# Patient Record
Sex: Male | Born: 1985 | Race: White | Hispanic: No | Marital: Single | Smoking: Former smoker
Health system: Southern US, Community
[De-identification: ages and names within clinical notes are randomized; demographics above are authoritative.]

## PROBLEM LIST (undated history)

## (undated) DIAGNOSIS — K219 Gastro-esophageal reflux disease without esophagitis: Secondary | ICD-10-CM

## (undated) DIAGNOSIS — T7840XA Allergy, unspecified, initial encounter: Secondary | ICD-10-CM

## (undated) DIAGNOSIS — R011 Cardiac murmur, unspecified: Secondary | ICD-10-CM

## (undated) DIAGNOSIS — J189 Pneumonia, unspecified organism: Secondary | ICD-10-CM

## (undated) HISTORY — DX: Allergy, unspecified, initial encounter: T78.40XA

## (undated) HISTORY — PX: HERNIA REPAIR: SHX51

## (undated) HISTORY — PX: WISDOM TOOTH EXTRACTION: SHX21

## (undated) HISTORY — DX: Gastro-esophageal reflux disease without esophagitis: K21.9

---
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
## 2006-01-18 ENCOUNTER — Ambulatory Visit: Payer: Self-pay | Admitting: Internal Medicine

## 2006-06-09 ENCOUNTER — Ambulatory Visit: Payer: Self-pay | Admitting: Internal Medicine

## 2006-08-02 ENCOUNTER — Encounter: Payer: Self-pay | Admitting: Internal Medicine

## 2006-08-02 ENCOUNTER — Ambulatory Visit: Payer: Self-pay | Admitting: Internal Medicine

## 2006-08-14 ENCOUNTER — Ambulatory Visit: Payer: Self-pay | Admitting: Internal Medicine

## 2007-02-15 ENCOUNTER — Ambulatory Visit: Payer: Self-pay | Admitting: Internal Medicine

## 2007-03-01 ENCOUNTER — Ambulatory Visit: Payer: Self-pay | Admitting: Internal Medicine

## 2008-01-09 ENCOUNTER — Ambulatory Visit: Payer: Self-pay | Admitting: Internal Medicine

## 2009-01-14 ENCOUNTER — Ambulatory Visit: Payer: Self-pay | Admitting: Family Medicine

## 2009-03-11 ENCOUNTER — Ambulatory Visit: Payer: Self-pay | Admitting: Internal Medicine

## 2009-08-18 ENCOUNTER — Ambulatory Visit: Payer: Self-pay | Admitting: Internal Medicine

## 2009-08-18 DIAGNOSIS — J309 Allergic rhinitis, unspecified: Secondary | ICD-10-CM | POA: Insufficient documentation

## 2009-08-18 LAB — CONVERTED CEMR LAB
Bilirubin Urine: NEGATIVE
Blood in Urine, dipstick: NEGATIVE
Glucose, Urine, Semiquant: NEGATIVE
Ketones, urine, test strip: NEGATIVE
Nitrite: NEGATIVE
Protein, U semiquant: NEGATIVE
Specific Gravity, Urine: 1.01
Urobilinogen, UA: 0.2
WBC Urine, dipstick: NEGATIVE
pH: 8

## 2009-11-27 ENCOUNTER — Ambulatory Visit: Payer: Self-pay | Admitting: Internal Medicine

## 2009-11-27 DIAGNOSIS — G5620 Lesion of ulnar nerve, unspecified upper limb: Secondary | ICD-10-CM

## 2010-01-19 ENCOUNTER — Encounter: Payer: Self-pay | Admitting: Internal Medicine

## 2010-01-22 ENCOUNTER — Ambulatory Visit: Payer: Self-pay | Admitting: Internal Medicine

## 2010-01-22 DIAGNOSIS — J019 Acute sinusitis, unspecified: Secondary | ICD-10-CM | POA: Insufficient documentation

## 2010-04-13 NOTE — Letter (Signed)
Summary: Medical Exam Form & Statement/[facility]  Medical Exam Form & Statement/[facility]   Imported By: Lanelle Bal 08/28/2009 09:06:52  _____________________________________________________________________  External Attachment:    Type:   Image     Comment:   External Document

## 2010-04-13 NOTE — Assessment & Plan Note (Signed)
Summary: FOR SINUS PROBLEM//PH   Vital Signs:  Patient profile:   25 year old male Weight:      168.8 pounds BMI:     26.34 Temp:     98.4 degrees F oral Pulse rate:   72 / minute Resp:     14 per minute BP sitting:   118 / 70  (left arm) Cuff size:   large  Vitals Entered By: Shonna Chock CMA (January 22, 2010 4:09 PM) CC: Cough (productive-green), stuffy head, and runny nose x 5days., URI symptoms   CC:  Cough (productive-green), stuffy head, and runny nose x 5days., and URI symptoms.  History of Present Illness:  RTI  Symptoms      This is a 25 year old man who presents with  RTI symptoms X 1 week , onset as rhinitis.  The patient reports nasal congestion, purulent nasal discharge, and productive cough with green secretions, but denies sore throat and earache.  The patient denies fever, dyspnea, and wheezing.  The patient denies headache.  The patient denies the following risk factors for Strep sinusitis: unilateral facial pain, tooth pain, and tender adenopathy.  Rx: Mucinex, Neti pot   Allergies: 1)  ! Ibuprofen  Physical Exam  General:  in no acute distress; alert,appropriate and cooperative throughout examination Ears:  External ear exam shows no significant lesions or deformities.  Otoscopic examination reveals clear canals, tympanic membranes are intact bilaterally without bulging, retraction, inflammation or discharge. Hearing is grossly normal bilaterally. Prominent vessels L TM Nose:  External nasal examination shows no deformity or inflammation. Nasal mucosa are pink and moist without lesions or exudates. Mouth:  Oral mucosa and oropharynx without lesions or exudates.  Teeth in good repair. Constantly clearing throat Lungs:  Normal respiratory effort, chest expands symmetrically. Lungs are clear to auscultation, no crackles or wheezes. Heart:  normal rate, regular rhythm, no gallop, no rub, no JVD, and grade 1/2-1  /6 systolic murmur.   Cervical Nodes:  No  lymphadenopathy noted Axillary Nodes:  No palpable lymphadenopathy   Impression & Recommendations:  Problem # 1:  BRONCHITIS-ACUTE (ICD-466.0)  His updated medication list for this problem includes:    Amoxicillin-pot Clavulanate 875-125 Mg Tabs (Amoxicillin-pot clavulanate) .Marland Kitchen... 1 every 12 hrs with a meal  Problem # 2:  SINUSITIS- ACUTE-NOS (ICD-461.9)  His updated medication list for this problem includes:    Amoxicillin-pot Clavulanate 875-125 Mg Tabs (Amoxicillin-pot clavulanate) .Marland Kitchen... 1 every 12 hrs with a meal  Complete Medication List: 1)  Amoxicillin-pot Clavulanate 875-125 Mg Tabs (Amoxicillin-pot clavulanate) .Marland Kitchen.. 1 every 12 hrs with a meal  Patient Instructions: 1)  Neti pot once daily as needed for sinus congestion. Mucinex DM for cough as needed . 2)  Drink as much NON dairy  fluid as you can tolerate for the next few days. Prescriptions: AMOXICILLIN-POT CLAVULANATE 875-125 MG TABS (AMOXICILLIN-POT CLAVULANATE) 1 every 12 hrs with a meal  #20 x 0   Entered and Authorized by:   Marga Melnick MD   Signed by:   Marga Melnick MD on 01/22/2010   Method used:   Printed then faxed to ...       [facility] [phone]* (retail)       [address]       [place], [place]  [address]       Ph: [phone]       Fax: [phone]   RxID:   [id number]  Orders Added: 1)  Est. Patient Level III [81859]

## 2010-04-13 NOTE — Assessment & Plan Note (Signed)
Summary: fill out med exam report/cbs   Vital Signs:  Patient profile:   25 year old male Height:      67.25 inches Weight:      190.2 pounds Temp:     99.0 degrees F oral Pulse rate:   72 / minute Resp:     14 per minute BP sitting:   122 / 82  (left arm) Cuff size:   large  Vitals Entered By: Shonna Chock (August 18, 2009 11:45 AM)  CC: General Medical Evaluation  Vision Screening:Left eye w/o correction: 20 / 25 Right Eye w/o correction: 20 / 25 Both eyes w/o correction:  20/ 20 Left eye with correction: 20 / 20 Right eye with correction: 20 / 15 Both eyes with correction: 20 / 15  Color vision testing: normal      Vision Entered By: Shonna Chock (August 18, 2009 11:52 AM)   CC:  General Medical Evaluation.  History of Present Illness: Mr. [name] [name] is here for completion of a medical form for [facility];he is engaged in a regular exercise program & is asymptomatic.  Preventive Screening-Counseling & Management  Caffeine-Diet-Exercise     Does Patient Exercise: yes  Allergies: 1)  ! Ibuprofen  Past History:  Past Medical History: PNA 1993 Allergic rhinitis  Past Surgical History: Inguinal herniorrhaphy age 90 months; Wisdom Teeth extraction  Family History: P aunt: lung or breast cancer;PGM: DM;PGF: MI Father: HTN Mother: negative  Siblings: negative  Social History: Never Smoked Alcohol use-no Single Regular exercise-yes: walking /running, calisthentics  5X/week w/o symptoms  Review of Systems  The patient denies anorexia, fever, weight loss, weight gain, vision loss, decreased hearing, hoarseness, chest pain, syncope, dyspnea on exertion, peripheral edema, prolonged cough, headaches, abdominal pain, melena, hematochezia, severe indigestion/heartburn, hematuria, suspicious skin lesions, difficulty walking, unusual weight change, abnormal bleeding, enlarged lymph nodes, and angioedema.    Physical Exam  General:  well-nourished ;alert,appropriate and  cooperative throughout examination Head:  Normocephalic and atraumatic without obvious abnormalities.  Eyes:  No corneal or conjunctival inflammation noted. Perrla. Funduscopic exam benign, without hemorrhages, exudates or papilledema.  Ears:  External ear exam shows no significant lesions or deformities.  Otoscopic examination reveals clear canals, tympanic membranes are intact bilaterally without bulging, retraction, inflammation or discharge. Hearing is grossly normal bilaterally. Nose:  External nasal examination shows no deformity or inflammation. Nasal mucosa are pink and moist without lesions or exudates. Mouth:  Oral mucosa and oropharynx without lesions or exudates.  Teeth in good repair. Neck:  No deformities, masses, or tenderness noted. Lungs:  Normal respiratory effort, chest expands symmetrically. Lungs are clear to auscultation, no crackles or wheezes. Heart:  normal rate, regular rhythm, no gallop, no rub, no JVD, no HJR.Clinically insignificant grade  1/2 /6 systolic murmur @ apex intermittently . Abdomen:  Bowel sounds positive,abdomen soft and non-tender without masses, organomegaly or hernias noted. Rectal:  Deferred Prostate:  Deferred Msk:  No deformity or scoliosis noted of thoracic or lumbar spine.   Pulses:  R and L carotid,radial,dorsalis pedis and posterior tibial pulses are full and equal bilaterally Extremities:  No clubbing, cyanosis, edema, or deformity noted with normal full range of motion of all joints.   Neurologic:  alert & oriented X3, strength normal in all extremities, gait normal, and DTRs symmetrical and normal.   Skin:  Intact without suspicious lesions or rashes. Tanned Cervical Nodes:  No lymphadenopathy noted Axillary Nodes:  No palpable lymphadenopathy Inguinal Nodes:  No significant adenopathy Psych:  memory intact for recent and remote, normally interactive, good eye contact, not anxious appearing, and not depressed appearing.     Impression &  Recommendations:  Problem # 1:  ROUTINE GENERAL MEDICAL EXAM@HEALTH  CARE FACL (ICD-V70.0) No contraindication to participation in [facility] training program  Other Orders: TB Skin Test [id number]) Admin 1st Vaccine (73710) UA Dipstick w/o Micro (manual) (81002)  Patient Instructions: 1)  Please pick up forms when Tb skin test read in 48-72 hrs   Immunizations Administered:  PPD Skin Test:    Vaccine Type: PPD    Site: left forearm    Mfr: Sanofi Pasteur    Dose: 0.1 ml    Route: ID    Given by: Shonna Chock    Exp. Date: 12/25/2010    Lot #: [id number] Patient aware to return in 48-72 hours for PPD (Skin Test reading)   Laboratory Results   Urine Tests    Routine Urinalysis   Color: lt. yellow Appearance: Clear Glucose: negative   (Normal Range: Negative) Bilirubin: negative   (Normal Range: Negative) Ketone: negative   (Normal Range: Negative) Spec. Gravity: 1.010   (Normal Range: 1.003-1.035) Blood: negative   (Normal Range: Negative) pH: 8.0   (Normal Range: 5.0-8.0) Protein: negative   (Normal Range: Negative) Urobilinogen: 0.2   (Normal Range: 0-1) Nitrite: negative   (Normal Range: Negative) Leukocyte Esterace: negative   (Normal Range: Negative)       Appended Document: fill out med exam report/cbs   PPD Results    Date of reading: 08/20/2009    Results: < 5mm    Interpretation: negative

## 2010-04-13 NOTE — Assessment & Plan Note (Signed)
Summary: ring finger & pinky numb/cbs   Vital Signs:  Patient profile:   25 year old male Weight:      176.2 pounds BMI:     27.49 Temp:     98.4 degrees F oral Pulse rate:   56 / minute Resp:     12 per minute BP sitting:   114 / 68  (left arm) Cuff size:   large  Vitals Entered By: Shonna Chock CMA (November 27, 2009 4:15 PM) CC: Ring/pinky finger numbness (both hands) x couple weeks, Lower Extremity Joint pain   CC:  Ring/pinky finger numbness (both hands) x couple weeks and Lower Extremity Joint pain.  History of Present Illness: He has had numbness & tingling in 4th & 5th fingers of both hands for 2 weeks.   The patient denies swelling, redness, and weakness in hands.  The symptoms began gradually but have  been  constant for past week.  He has been in a course for 4 weeks ; he catches himself leaning on elbows for prolonged periods. No PMH of elbow injury .  Current Medications (verified): 1)  None  Allergies: 1)  ! Ibuprofen  Physical Exam  General:  well-nourished,in no acute distress; alert,appropriate and cooperative throughout examination Pulses:  R and L radial pulses are full and equal bilaterally Extremities:  No clubbing, cyanosis, edema, or deformity noted .Wart 3rd R DIP area. Symptoms elicitable by percusion over ulnar nerve on L. Neurologic:  alert & oriented X3, strength normal in all extremities, and DTRs symmetrical and normal.   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 NCT/EMG if no  better. Neurontin 100 mg three times a day as needed if symptoms progress.

## 2010-04-13 NOTE — Letter (Signed)
Summary: Medical Clearance/[facility] [employer]  Medical Clearance/[employer] [facility] [employer]   Imported By: Lanelle Bal 01/28/2010 13:20:52  _____________________________________________________________________  External Attachment:    Type:   Image     Comment:   External Document

## 2011-02-15 ENCOUNTER — Ambulatory Visit (INDEPENDENT_AMBULATORY_CARE_PROVIDER_SITE_OTHER): Payer: BC Managed Care – PPO | Admitting: Internal Medicine

## 2011-02-15 ENCOUNTER — Encounter: Payer: Self-pay | Admitting: Internal Medicine

## 2011-02-15 VITALS — BP 120/80 | HR 61 | Temp 98.4°F | Ht 66.0 in | Wt 153.6 lb

## 2011-02-15 DIAGNOSIS — J40 Bronchitis, not specified as acute or chronic: Secondary | ICD-10-CM

## 2011-02-15 MED ORDER — AMOXICILLIN 500 MG PO CAPS: 1000.0000 mg | ORAL_CAPSULE | Freq: Two times a day (BID) | ORAL | Status: AC

## 2011-02-15 NOTE — Progress Notes (Signed)
  Subjective:    Patient ID: [name] [name], male    DOB: 02/28/1986, 25 y.o.   MRN: [id number]  HPI Symptoms started 2 weeks ago, initially with head congestion, now also with congestion in the chest, cough, yellow and green sputum. Has been taking Mucinex Patient is a nonsmoker  Past Medical History  Diagnosis Date  . Allergy    Past Surgical History  Procedure Date  . Hernia repair      Review of Systems No fever or chills No nausea, vomiting, diarrhea No myalgias No wheezing    Objective:   Physical Exam  Constitutional: He appears well-developed and well-nourished. No distress.  HENT:  Head: Normocephalic and atraumatic.  Right Ear: External ear normal.  Left Ear: External ear normal.  Mouth/Throat: No oropharyngeal exudate.       Face symmetric, nontender to palpation  Cardiovascular: Normal rate and regular rhythm.   No murmur heard. Pulmonary/Chest: Effort normal and breath sounds normal. No respiratory distress. He has no wheezes. He has no rales.  Skin: He is not diaphoretic.       Assessment & Plan:  Bronchitis: Exam is quite benign however this history is consistent with bronchitis. See instructions

## 2011-02-15 NOTE — Patient Instructions (Signed)
Rest, fluids , tylenol For cough, take Mucinex DM twice a day as needed  For congestion use Sudafed (pseudoephedrine) behind the counter 30 mg every 4 to 6 hours as needed Take the antibiotic as prescribed ----> Amoxicillin Call if no better in few days Call anytime if the symptoms are severe

## 2011-03-23 ENCOUNTER — Encounter: Payer: Self-pay | Admitting: Internal Medicine

## 2011-03-23 ENCOUNTER — Ambulatory Visit (INDEPENDENT_AMBULATORY_CARE_PROVIDER_SITE_OTHER): Payer: BC Managed Care – PPO | Admitting: Internal Medicine

## 2011-03-23 VITALS — BP 112/70 | HR 61 | Temp 98.7°F | Resp 12 | Wt 156.4 lb

## 2011-03-23 DIAGNOSIS — J209 Acute bronchitis, unspecified: Secondary | ICD-10-CM

## 2011-03-23 MED ORDER — AZITHROMYCIN 250 MG PO TABS: ORAL_TABLET | ORAL | Status: AC

## 2011-03-23 NOTE — Patient Instructions (Signed)
Plain Mucinex for thick secretions ;force NON dairy fluids. Use a Neti pot daily as needed for sinus congestion  

## 2011-03-23 NOTE — Progress Notes (Signed)
  Subjective:    Patient ID: [name] [name], male    DOB: [birth year], 26 y.o.   MRN: [id number]  HPI Respiratory tract infection Onset/symptoms:1 week ago as dry cough Exposures (illness/environmental/extrinsic):co-workers Progression of symptoms:to sputum production Treatments/response:Mucinex & Tylenol with some benefit Present symptoms: Fever/chills/sweats:no Frontal headache:no Facial pain:no Nasal purulence:no Sore throat:no Dental pain:no Lymphadenopathy:no Wheezing/shortness of breath:slight wheezing Cough/sputum/hemoptysis:white sputum Pleuritic pain:no Associated extrinsic/allergic symptoms:itchy eyes/ sneezing:no Past medical history: Seasonal allergies: no/asthma:only as child Smoking history:never           Review of Systems   He had bronchitis 6 weeks ago and took amoxicillin and Mucinex.     Objective:   Physical Exam General appearance:good health ;well nourished; no acute distress or increased work of breathing is present.  No  lymphadenopathy about the head, neck, or axilla noted.   Eyes: No conjunctival inflammation or lid edema is present. .  Ears:  External ear exam shows no significant lesions or deformities.  Otoscopic examination reveals clear canals, tympanic membranes are intact bilaterally without bulging, retraction, inflammation or discharge.  Nose:  External nasal examination shows no deformity or inflammation. Nasal mucosa are pink and moist without lesions or exudates. No septal dislocation or deviation.No obstruction to airflow.   Oral exam: Dental hygiene is good; lips and gums are healthy appearing.There is no oropharyngeal erythema or exudate noted.      Heart:  Normal rate and regular rhythm. S1 and S2 normal without gallop,  click, rub or other extra sounds. Grade 1/2 systolic murmur   Lungs:Chest clear to auscultation; no wheezes, rhonchi,rales ,or rubs present.No increased work of breathing.    Extremities:  No cyanosis,  edema, or clubbing  noted    Skin: Warm & dry        Assessment & Plan:  #1 bronchitis, acute. Some  wheezing reported; none on exam at this time. No history or physical findings to suggest associated rhinosinusitis  Plan: See orders and recommendations

## 2011-04-25 ENCOUNTER — Encounter: Payer: Self-pay | Admitting: Internal Medicine

## 2011-04-25 ENCOUNTER — Ambulatory Visit (INDEPENDENT_AMBULATORY_CARE_PROVIDER_SITE_OTHER): Payer: BC Managed Care – PPO | Admitting: Internal Medicine

## 2011-04-25 DIAGNOSIS — R5383 Other fatigue: Secondary | ICD-10-CM

## 2011-04-25 DIAGNOSIS — R259 Unspecified abnormal involuntary movements: Secondary | ICD-10-CM

## 2011-04-25 DIAGNOSIS — R5381 Other malaise: Secondary | ICD-10-CM

## 2011-04-25 DIAGNOSIS — R251 Tremor, unspecified: Secondary | ICD-10-CM

## 2011-04-25 LAB — CBC WITH DIFFERENTIAL/PLATELET
Basophils Absolute: 0 10*3/uL (ref 0.0–0.1)
Basophils Relative: 0.4 % (ref 0.0–3.0)
Eosinophils Absolute: 0.1 10*3/uL (ref 0.0–0.7)
Eosinophils Relative: 1.5 % (ref 0.0–5.0)
HCT: 42.5 % (ref 39.0–52.0)
Hemoglobin: 15 g/dL (ref 13.0–17.0)
Lymphocytes Relative: 27.7 % (ref 12.0–46.0)
Lymphs Abs: 1.6 10*3/uL (ref 0.7–4.0)
MCHC: 35.2 g/dL (ref 30.0–36.0)
MCV: 98.5 fl (ref 78.0–100.0)
Monocytes Absolute: 0.5 10*3/uL (ref 0.1–1.0)
Monocytes Relative: 9.1 % (ref 3.0–12.0)
Neutro Abs: 3.6 10*3/uL (ref 1.4–7.7)
Neutrophils Relative %: 61.3 % (ref 43.0–77.0)
Platelets: 189 10*3/uL (ref 150.0–400.0)
RBC: 4.32 Mil/uL (ref 4.22–5.81)
RDW: 12.4 % (ref 11.5–14.6)
WBC: 5.8 10*3/uL (ref 4.5–10.5)

## 2011-04-25 LAB — BASIC METABOLIC PANEL
BUN: 17 mg/dL (ref 6–23)
CO2: 29 mEq/L (ref 19–32)
Calcium: 9.3 mg/dL (ref 8.4–10.5)
Chloride: 103 mEq/L (ref 96–112)
Creatinine, Ser: 1 mg/dL (ref 0.4–1.5)
GFR: 100.6 mL/min (ref >60.00)
Glucose, Bld: 78 mg/dL (ref 70–99)
Potassium: 4.4 mEq/L (ref 3.5–5.1)
Sodium: 137 mEq/L (ref 135–145)

## 2011-04-25 LAB — SEDIMENTATION RATE: Sed Rate: 5 mm/hr (ref 0–22)

## 2011-04-25 LAB — HEPATIC FUNCTION PANEL
ALT: 17 U/L (ref 0–53)
AST: 21 U/L (ref 0–37)
Albumin: 4.4 g/dL (ref 3.5–5.2)
Alkaline Phosphatase: 59 U/L (ref 39–117)
Bilirubin, Direct: 0.2 mg/dL (ref 0.0–0.3)
Total Bilirubin: 1.2 mg/dL (ref 0.3–1.2)
Total Protein: 7.3 g/dL (ref 6.0–8.3)

## 2011-04-25 LAB — T4, FREE: Free T4: 0.88 ng/dL (ref 0.60–1.60)

## 2011-04-25 LAB — TSH: TSH: 1.48 u[IU]/mL (ref 0.35–5.50)

## 2011-04-25 NOTE — Patient Instructions (Addendum)
Symptoms could indicate "reactive hypoglycemia", low sugars due to the pancreas excreting excess insulin in response to glucose elevations from " hyperglycemic carbs"  in diet. Eat a low-fat diet with lots of fruits and vegetables, up to 7-9 servings per day. Consume less than 40  Grams (preferably ZERO) of sugar per day from foods & drinks with High Fructose Corn Sugar as #1,2,3 or # 4 on label. Follow a low carb nutrition program such as The [employer] Sugar [employer] or [place] to prevent wide sugar swings. White carbohydrates (potatoes, rice, bread, and pasta) have a high spike of sugar and a high load of sugar. For example a  baked potato has a cup of sugar and a  french fry  2 teaspoons of sugar. Yams, wild  rice, whole grained bread &  wheat pasta have been much lower spike and load of  sugar. Portions should be the size of a deck of cards or your palm.  A protein bar may  help prevent wide fluctuations in sugars well.  To prevent tremor , avoid stimulants such as decongestants, diet pills, nicotine, or caffeine (coffee, tea, cola, or chocolate) to excess & get adequate sleep

## 2011-04-25 NOTE — Progress Notes (Signed)
Subjective:    Patient ID: [name] [name], male    DOB: 17-Apr-1985, 26 y.o.   MRN: [id number]  HPI FATIGUE Onset: 6 months  Fatigue @ rest : yes   ;  not worse with exertion  Primarily motivational fatigue:yes; work is sedentary Symptoms: Fever/ chills : no  Night sweats: no                                                                                            Vision changes ( blurred/ double/ loss): no                                                                                                   Hoarseness or swallowing dysfunction: no                                                                                         Bowel changes( constipation/ diarrhea): no                                                                                    Weight change: stable 155-8 Exertional chest pain: no Dyspnea on exertion: no  Cough: no  Hemoptysis: no  New medications: no Leg swelling: no  Orthopnea: no  PND: no Melena/ rectal bleeding: no Adenopathy: no Severe snoring:no Daytime sleepiness:no Skin / hair / nail changes:no Temperature intolerance( heat/ cold) : no                                                                                                 Feeling depressed:no  Anhedonia:no  Altered appetite: no  Poor sleep/ Apnea : no Abnormal bruising /  bleeding or enlarged lymph nodes: no                                                                     PMH/ FH of thyroid disease: no     Review of Systems   He denies any significant joint swelling, pain, or redness. He has had intermittent slight weakness between meals; he denies associated tachycardia, diaphoresis, or near syncope. At times he has difficulty finishing his meal during the workday     Objective:   Physical Exam Gen.: Healthy and well-nourished in appearance. Alert, appropriate and cooperative throughout exam. Head: Normocephalic without obvious abnormalities; no alopecia  Eyes: No corneal or  conjunctival inflammation noted. Pupils equal round reactive to light and accommodation.  Extraocular motion intact. No proptosis or lid lag  Neck: No deformities, masses, or tenderness noted. Range of motion &  Thyroid normal Lungs: Normal respiratory effort; chest expands symmetrically. Lungs are clear to auscultation without rales, wheezes, or increased work of breathing. Heart: Normal rate and rhythm. Normal S1 and S2. No gallop, click, or rub. Grade 1/6 systolic murmur  Abdomen: Bowel sounds normal; abdomen soft and nontender. No masses, organomegaly or hernias noted.                                                                  Musculoskeletal/extremities: No deformity or scoliosis noted of  the thoracic or lumbar spine. No clubbing, cyanosis, edema, or deformity noted. Range of motion  normal .Tone & strength  normal.Joints normal. Nail health  Good.No onycholysis Vascular: Carotid, radial artery, dorsalis pedis and  posterior tibial pulses are full and equal. No bruits present. Neurologic: Alert and oriented x3. Deep tendon reflexes symmetrical and 1/2+. Minor hand tremor         Skin: Intact without suspicious lesions or rashes. Lymph: No cervical, axillary lymphadenopathy present. Psych: Mood and affect are normal. Normally interactive                                                                                         Assessment & Plan:    #1 fatigue x6 months without definite localizing signs or symptoms  #2 possible intermittent reactive hypoglycemia  #3 mild tremor  Plan: See orders and recommendations

## 2012-01-05 ENCOUNTER — Encounter: Payer: Self-pay | Admitting: Internal Medicine

## 2012-01-05 ENCOUNTER — Ambulatory Visit (INDEPENDENT_AMBULATORY_CARE_PROVIDER_SITE_OTHER): Payer: BC Managed Care – PPO | Admitting: Internal Medicine

## 2012-01-05 VITALS — BP 126/88 | HR 63 | Temp 98.7°F | Wt 167.6 lb

## 2012-01-05 DIAGNOSIS — J00 Acute nasopharyngitis [common cold]: Secondary | ICD-10-CM

## 2012-01-05 MED ORDER — FLUTICASONE PROPIONATE 50 MCG/ACT NA SUSP: 1.0000 | Freq: Two times a day (BID) | NASAL | Status: DC | PRN

## 2012-01-05 MED ORDER — AMOXICILLIN 500 MG PO CAPS: 500.0000 mg | ORAL_CAPSULE | Freq: Three times a day (TID) | ORAL | Status: DC

## 2012-01-05 NOTE — Progress Notes (Signed)
  Subjective:    Patient ID: [name] [name], male    DOB: Sep 14, 1985, 26 y.o.   MRN: [id number]  HPI  Symptoms began one week ago as pressure in both the frontal and maxillary sinuses; this progressed to persistent congestion. He's also noted decreased hearing.  He has not had fever, chills, sweats, or nasal purulence. Tylenol has been of some benefit.  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    Review of Systems  There's been no associated cough or sputum production. There was no extrinsic picture prior to the onset of symptoms. Specifically he denies itchy, watery eyes or sneezing.     Objective:   Physical Exam General appearance:good health ;well nourished; no acute distress or increased work of breathing is present.  No  lymphadenopathy about the head, neck, or axilla noted.   Eyes: No conjunctival inflammation or lid edema is present.   Ears:  External ear exam shows no significant lesions or deformities.  Otoscopic examination reveals clear canals, tympanic membranes are intact bilaterally without bulging, retraction, inflammation or discharge. Hearing is intact to whisper at 6 ft bilaterally  Nose:  External nasal examination shows no deformity or inflammation. Nasal mucosa are pink and moist without lesions or exudates. No septal dislocation or deviation.No obstruction to airflow.   Oral exam: Dental hygiene is good; lips and gums are healthy appearing.There is no oropharyngeal erythema or exudate noted.   Neck:  No deformities,  masses, or tenderness noted.      Heart:  Normal rate and regular rhythm. S1 and S2 normal without gallop, murmur, click, rub or other extra sounds.   Lungs:Chest clear to auscultation; no wheezes, rhonchi,rales ,or rubs present.No increased work of breathing.    Extremities:  No cyanosis, edema, or clubbing  noted    Skin: Warm & dry          Assessment & Plan:  #1 sinus congestion with frontal headache and facial  pain; absence of fever and nasal purulence. Occupational exposure to dusts and possibly mold.  Plan: See orders and recommendations

## 2012-01-05 NOTE — Patient Instructions (Addendum)
Plain Mucinex for thick secretions ;force NON dairy fluids . Use a Neti pot daily as needed for sinus congestion; going from open side to congested side . Nasal cleansing in the shower as discussed. Make sure that all residual soap is removed to prevent irritation. Fluticasone 1 spray in each nostril twice a day as needed. Use the "crossover" technique as discussed. Plain Allegra 160 daily as needed for itchy eyes & sneezing.  Fill the antibiotic only if you're having fever and purulent nasal secretions.

## 2012-01-23 ENCOUNTER — Telehealth: Payer: Self-pay | Admitting: Internal Medicine

## 2012-01-23 MED ORDER — AMOXICILLIN 500 MG PO CAPS: 500.0000 mg | ORAL_CAPSULE | Freq: Three times a day (TID) | ORAL | Status: DC

## 2012-01-23 NOTE — Telephone Encounter (Signed)
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

## 2012-01-23 NOTE — Telephone Encounter (Signed)
Amox 500 mg tid #21 

## 2012-01-23 NOTE — Telephone Encounter (Signed)
Dr.Hopper patient was seen 19 days ago and is calling about rx given on 01-05-12. Please advise

## 2012-01-23 NOTE — Telephone Encounter (Signed)
Rx sent to pharmacy, mother aware

## 2012-04-04 ENCOUNTER — Ambulatory Visit (INDEPENDENT_AMBULATORY_CARE_PROVIDER_SITE_OTHER): Payer: BC Managed Care – PPO | Admitting: Internal Medicine

## 2012-04-04 ENCOUNTER — Encounter: Payer: Self-pay | Admitting: Internal Medicine

## 2012-04-04 VITALS — BP 122/70 | HR 74 | Temp 99.0°F | Wt 176.0 lb

## 2012-04-04 DIAGNOSIS — J0100 Acute maxillary sinusitis, unspecified: Secondary | ICD-10-CM

## 2012-04-04 DIAGNOSIS — J209 Acute bronchitis, unspecified: Secondary | ICD-10-CM

## 2012-04-04 MED ORDER — CEFUROXIME AXETIL 500 MG PO TABS: 500.0000 mg | ORAL_TABLET | Freq: Two times a day (BID) | ORAL | Status: DC

## 2012-04-04 NOTE — Progress Notes (Signed)
  Subjective:    Patient ID: [name] [name], male    DOB: 25-Jan-1986, 27 y.o.   MRN: [id number]  HPI The respiratory tract symptoms began eve of  04/01/12  as head congestion & rhinitis. Significant active  associated symptoms include  facial pressure, dental pain, slight sore throat, & nasal purulence. Cough is  associated with  shortness of breath and wheezing . Sputum is described as brown/ green     Flu shot not current        Treatment with  Neti pot & Mucinex was partially effective   There is no history of asthma. The patient had never smoked quit smoking in                  Review of Systems Symptoms not present include frontal headache,  sore throat, earache , and otic discharge  Fever,chills & sweats not present   Myalgias and arthralgias were not present   Extrinsic symptoms of itchy, watery eyes, or sneezing were not present .     Objective:   Physical Exam General appearance:good health ;well nourished; no acute distress or increased work of breathing is present.  No  lymphadenopathy about the head, neck, or axilla noted.  Eyes: No conjunctival inflammation or lid edema is present. Ears:  External ear exam shows no significant lesions or deformities.  Otoscopic examination reveals clear canals, tympanic membranes are intact bilaterally without bulging, retraction, inflammation or discharge. Nose:  External nasal examination shows no deformity or inflammation. Nasal mucosa are boggy without lesions or exudates. No septal dislocation or deviation.No obstruction to airflow. Hyponasal speech Oral exam: Dental hygiene is good; lips and gums are healthy appearing.There is no oropharyngeal erythema or exudate noted.  Neck:  No deformities, masses, or tenderness noted.    Heart:  Normal rate and regular rhythm. S1 and S2 normal without gallop, murmur, click, rub or other extra sounds.  Lungs:Chest clear to auscultation; no wheezes, rhonchi,rales ,or rubs  present.No increased work of breathing.   Extremities:  No cyanosis, edema, or clubbing  noted  Skin: Warm & dry        Assessment & Plan:  #85maxillary  rhinosinusitis with bronchitis  Plan: Nasal hygiene interventions discussed. See prescription medications

## 2012-04-04 NOTE — Patient Instructions (Addendum)

## 2012-05-01 ENCOUNTER — Telehealth: Payer: Self-pay | Admitting: *Deleted

## 2012-05-01 NOTE — Telephone Encounter (Signed)
Dr. Marisue Ivan office called back & their office is about to close for the day so the pt's mom needs to be called to notify her whether or not the pt needs to take an antibiotic. The pt's mother can be reached at [phone]. If the pt does need to take an antibiotic then Dr. Alwyn Ren will need to call this into the pt's pharmacy. If the pt does not need to take an antibiotic then this needs to be faxed to Dr. Marisue Ivan office @ [phone].

## 2012-05-01 NOTE — Telephone Encounter (Signed)
Pt's dentist office called stating that the pt is coming in the morning & they need to know if the pt needs to be pre-medicated with an antibiotic before his dental procedure. Please advise.

## 2012-05-01 NOTE — Telephone Encounter (Signed)
Call Pt mom and obtain Pt contact info [phone]. Discuss with patient

## 2012-05-01 NOTE — Telephone Encounter (Signed)
I reviewed the old records; he has a grade 1/2 over 6 systolic murmur. This is insignificant and does not require SBE prophylaxis before dental work.

## 2012-05-01 NOTE — Telephone Encounter (Signed)
Hopp please advise, no notation in patient's chart that he needs to be pre-medicated. No EKG on file for patient

## 2013-01-17 ENCOUNTER — Encounter: Payer: Self-pay | Admitting: Internal Medicine

## 2013-01-17 ENCOUNTER — Ambulatory Visit (INDEPENDENT_AMBULATORY_CARE_PROVIDER_SITE_OTHER): Payer: BC Managed Care – PPO | Admitting: Internal Medicine

## 2013-01-17 VITALS — BP 125/82 | HR 89 | Temp 98.9°F

## 2013-01-17 DIAGNOSIS — J209 Acute bronchitis, unspecified: Secondary | ICD-10-CM

## 2013-01-17 MED ORDER — AZITHROMYCIN 250 MG PO TABS: ORAL_TABLET | ORAL | Status: DC

## 2013-01-17 NOTE — Progress Notes (Signed)
  Subjective:    Patient ID: [name] [name], male    DOB: 1985/06/24, 27 y.o.   MRN: [id number]  HPI   Symptoms began 01/06/13 as head congestion and sore throat. Over the next 48 hours he developed chest congestion with production of yellow sputum. He also had some yellow nasal drainage. The sputum was a greater volume than the nasal discharge.  He has been using Mucinex cough syrup was some response. Symptoms have improved. He now has a cough intermittently productive of clear scant sputum    Review of Systems  He did not have any extrinsic symptoms of itchy, watery eyes, sneezing. There was no associated fever, chills, sweats. With a cough with no shortness of breath or wheezing.  He also denied signs of rhinosinusitis such as frontal headache, facial pain, dental pain, otic pain, or otic discharge.     Objective:   Physical Exam General appearance:good health ;well nourished; no acute distress or increased work of breathing is present.  No  lymphadenopathy about the head, neck, or axilla noted.   Eyes: No conjunctival inflammation or lid edema is present.   Ears:  External ear exam shows no significant lesions or deformities.  Otoscopic examination reveals clear canals, tympanic membranes are intact bilaterally without bulging, retraction, inflammation or discharge.  Nose:  External nasal examination shows no deformity or inflammation. Nasal mucosa are erythematous without lesions or exudates. No septal dislocation or deviation.No obstruction to airflow.   Oral exam: Dental hygiene is good; lips and gums are healthy appearing.There is no oropharyngeal erythema or exudate noted.   Neck:  No deformities,  masses, or tenderness noted.     Heart:  Normal rate and regular rhythm. S1 and S2 normal without gallop, murmur, click, rub or other extra sounds.   Lungs:Chest clear to auscultation; no wheezes, rhonchi,rales ,or rubs present.No increased work of breathing.    Extremities:  No  cyanosis, edema, or clubbing  noted    Skin: Warm & dry         Assessment & Plan:  #1 acute bronchitis w/o bronchospasm #2 URI, essentially resolved Plan: See orders and recommendations

## 2013-01-17 NOTE — Patient Instructions (Signed)

## 2013-01-28 ENCOUNTER — Other Ambulatory Visit: Payer: Self-pay | Admitting: *Deleted

## 2013-01-28 ENCOUNTER — Telehealth: Payer: Self-pay | Admitting: Internal Medicine

## 2013-01-28 DIAGNOSIS — J209 Acute bronchitis, unspecified: Secondary | ICD-10-CM

## 2013-01-28 DIAGNOSIS — J0100 Acute maxillary sinusitis, unspecified: Secondary | ICD-10-CM

## 2013-01-28 MED ORDER — CEFUROXIME AXETIL 500 MG PO TABS: 500.0000 mg | ORAL_TABLET | Freq: Two times a day (BID) | ORAL | Status: DC

## 2013-01-28 NOTE — Telephone Encounter (Signed)
Please advise 

## 2013-01-28 NOTE — Telephone Encounter (Signed)
Continue aggressive nasal hygiene as discussed. Cefuroxime 500 mg bid # 14

## 2013-01-28 NOTE — Telephone Encounter (Signed)
Patient's Mom is calling because patient still has symptoms of sinus infection since he came in on 01/17/13. Mom states "he is still seeing color in mucus and head is still stopped up." She wants to know if something stronger can be called in. Please advise.

## 2013-01-29 NOTE — Telephone Encounter (Signed)
Medication called into the pharmacy.

## 2013-02-12 ENCOUNTER — Encounter: Payer: Self-pay | Admitting: Internal Medicine

## 2013-02-12 ENCOUNTER — Ambulatory Visit (INDEPENDENT_AMBULATORY_CARE_PROVIDER_SITE_OTHER): Payer: BC Managed Care – PPO | Admitting: Internal Medicine

## 2013-02-12 VITALS — BP 99/65 | HR 73 | Temp 96.7°F | Ht 68.25 in | Wt 173.4 lb

## 2013-02-12 DIAGNOSIS — J019 Acute sinusitis, unspecified: Secondary | ICD-10-CM

## 2013-02-12 DIAGNOSIS — J209 Acute bronchitis, unspecified: Secondary | ICD-10-CM

## 2013-02-12 MED ORDER — CLARITHROMYCIN ER 500 MG PO TB24: 1000.0000 mg | ORAL_TABLET | Freq: Every day | ORAL | Status: DC

## 2013-02-12 NOTE — Progress Notes (Signed)
   Subjective:    Patient ID: [name] [name], male    DOB: 12-27-1985, 27 y.o.   MRN: [id number]  HPI   Symptoms began 02/09/13 as head congestion followed 11/30 by chest congestion with productive cough, shortness of breath, and wheezing. He describes some discomfort in the right lateral thorax due to the coughing.  He also describes pain in the frontal sinus and maxillary sinus areas and nasal purulence as well as dental pain.  There is more purulent material from the chest than from the head  He's had associated chills.  He had some mild sneezing.  He does have a history of asthma. He smokes 2 pp week  His only treatment has been using Mucinex and a Neti pot.    Review of Systems  He has not had itchy, watery eyes. He also denies pelvic pain or otic discharge. There's been no associa  Heted fever and sweats.     Objective:   Physical Exam  General appearance:good health ;well nourished; no acute distress or increased work of breathing is present.  No  lymphadenopathy about the head, neck, or axilla noted.  Appears tired  Eyes: No conjunctival inflammation or lid edema is present.   Ears:  External ear exam shows no significant lesions or deformities.  Otoscopic examination reveals clear canals, tympanic membranes are intact bilaterally without bulging, retraction, inflammation or discharge.  Nose:  External nasal examination shows no deformity or inflammation. Nasal mucosa are erythematous without lesions or exudates. R septal  Deviation w/o obstruction to airflow.   Oral exam: Dental hygiene is good; lips and gums are healthy appearing.There is no oropharyngeal erythema or exudate noted.   Neck:  No deformities,  masses, or tenderness noted.      Heart:  Normal rate and regular rhythm. S1 and S2 normal without gallop, murmur, click, rub or other extra sounds.   Lungs:Chest clear to auscultation; no wheezes, rhonchi,rales ,or rubs present.No increased work of breathing.  No pain to compression or palpation over the right inferior thoracic cage    Extremities:  No cyanosis, edema, or clubbing  noted    Skin: Warm & dry .         Assessment & Plan:  #1 acute bronchitis w/o bronchospasm #2 sinusitis, acute #3 smoker Plan: See orders and recommendations

## 2013-02-12 NOTE — Patient Instructions (Signed)
Your next office appointment will be determined based upon review of your pending labs. Those instructions will be transmitted to you through My Chart . Please report any significant change in your symptoms. 

## 2013-02-12 NOTE — Progress Notes (Signed)
Pre visit review using our clinic review tool, if applicable. No additional management support is needed unless otherwise documented below in the visit note. 

## 2013-02-13 ENCOUNTER — Encounter: Payer: Self-pay | Admitting: *Deleted

## 2013-02-13 LAB — CBC WITH DIFFERENTIAL/PLATELET
Basophils Absolute: 0 10*3/uL (ref 0.0–0.1)
Eosinophils Relative: 9.5 % — ABNORMAL HIGH (ref 0.0–5.0)
HCT: 43.8 % (ref 39.0–52.0)
Hemoglobin: 15.1 g/dL (ref 13.0–17.0)
Lymphs Abs: 1.8 10*3/uL (ref 0.7–4.0)
MCV: 95.9 fl (ref 78.0–100.0)
Monocytes Absolute: 0.5 10*3/uL (ref 0.1–1.0)
Monocytes Relative: 7.3 % (ref 3.0–12.0)
Neutro Abs: 4.3 10*3/uL (ref 1.4–7.7)
RDW: 12.3 % (ref 11.5–14.6)

## 2013-11-05 ENCOUNTER — Ambulatory Visit (INDEPENDENT_AMBULATORY_CARE_PROVIDER_SITE_OTHER): Payer: BC Managed Care – PPO | Admitting: Internal Medicine

## 2013-11-05 ENCOUNTER — Other Ambulatory Visit (INDEPENDENT_AMBULATORY_CARE_PROVIDER_SITE_OTHER): Payer: BC Managed Care – PPO

## 2013-11-05 ENCOUNTER — Encounter: Payer: Self-pay | Admitting: Internal Medicine

## 2013-11-05 VITALS — BP 124/82 | HR 78 | Temp 98.2°F | Wt 175.5 lb

## 2013-11-05 DIAGNOSIS — R5381 Other malaise: Secondary | ICD-10-CM

## 2013-11-05 DIAGNOSIS — R5383 Other fatigue: Principal | ICD-10-CM

## 2013-11-05 DIAGNOSIS — M2550 Pain in unspecified joint: Secondary | ICD-10-CM

## 2013-11-05 DIAGNOSIS — K5900 Constipation, unspecified: Secondary | ICD-10-CM

## 2013-11-05 LAB — CBC WITH DIFFERENTIAL/PLATELET
Basophils Absolute: 0 10*3/uL (ref 0.0–0.1)
Basophils Relative: 0.5 % (ref 0.0–3.0)
EOS ABS: 0.2 10*3/uL (ref 0.0–0.7)
Eosinophils Relative: 2.7 % (ref 0.0–5.0)
HCT: 45.3 % (ref 39.0–52.0)
HEMOGLOBIN: 15.4 g/dL (ref 13.0–17.0)
Lymphocytes Relative: 25.7 % (ref 12.0–46.0)
Lymphs Abs: 1.5 10*3/uL (ref 0.7–4.0)
MCHC: 34 g/dL (ref 30.0–36.0)
MCV: 96.4 fl (ref 78.0–100.0)
MONO ABS: 0.7 10*3/uL (ref 0.1–1.0)
Monocytes Relative: 12.3 % — ABNORMAL HIGH (ref 3.0–12.0)
NEUTROS ABS: 3.4 10*3/uL (ref 1.4–7.7)
NEUTROS PCT: 58.8 % (ref 43.0–77.0)
Platelets: 165 10*3/uL (ref 150.0–400.0)
RBC: 4.7 Mil/uL (ref 4.22–5.81)
RDW: 12 % (ref 11.5–15.5)
WBC: 5.8 10*3/uL (ref 4.0–10.5)

## 2013-11-05 LAB — BASIC METABOLIC PANEL
BUN: 15 mg/dL (ref 6–23)
CO2: 30 mEq/L (ref 19–32)
CREATININE: 0.9 mg/dL (ref 0.4–1.5)
Calcium: 9.3 mg/dL (ref 8.4–10.5)
Chloride: 104 mEq/L (ref 96–112)
GFR: 106.15 mL/min (ref >60.00)
Glucose, Bld: 80 mg/dL (ref 70–99)
Potassium: 4.2 mEq/L (ref 3.5–5.1)
Sodium: 140 mEq/L (ref 135–145)

## 2013-11-05 LAB — HEPATIC FUNCTION PANEL
ALBUMIN: 4.4 g/dL (ref 3.5–5.2)
ALT: 12 U/L (ref 0–53)
AST: 18 U/L (ref 0–37)
Alkaline Phosphatase: 62 U/L (ref 39–117)
Bilirubin, Direct: 0 mg/dL (ref 0.0–0.3)
TOTAL PROTEIN: 7.6 g/dL (ref 6.0–8.3)
Total Bilirubin: 0.8 mg/dL (ref 0.2–1.2)

## 2013-11-05 LAB — SEDIMENTATION RATE: SED RATE: 5 mm/h (ref 0–22)

## 2013-11-05 LAB — T4, FREE: Free T4: 0.97 ng/dL (ref 0.60–1.60)

## 2013-11-05 LAB — TSH: TSH: 2.33 u[IU]/mL (ref 0.35–4.50)

## 2013-11-05 NOTE — Patient Instructions (Signed)
Please have your sleep pattern observed. Of concern would be excess snoring or spells of stopping breathing (apnea). If these are suggested based on this monitor; a sleep specialty evaluation is indicated.  Your next office appointment will be determined based upon review of your pending labs . Those instructions will be transmitted to you through My Chart  . Followup as needed for your acute issue. Please report any significant change in your symptoms.

## 2013-11-05 NOTE — Progress Notes (Signed)
Subjective:    Patient ID: [name] [name], male    DOB: 03/22/1985, 28 y.o.   MRN: [id number]  HPI   He describes fatigue for at least a month. He states that symptoms have been essentially constant ,even at rest.  He states he goes to sleep very easily without frequent awakening, nightmares,or  restless leg symptoms. Even if he sleeps at least 8 hours ,which is typical , he awakens tired. He denies any description of excess snoring or apnea per observers.  He does have some hoarseness. He also has constipation. He describes some joint pain in legs, neck & back.  Minor symptoms include intermittent chills and sweats. He has a minor cough with scant sputum production .  He works 9 AM to 9 PM 3 days on and then 2 days off. This pattern has not changed for 3 years.    Review of Systems No significant change in weight;  change in appetite. No blurred, double vision ,loss of vision No palpitations; racing; irregularity No diarrhea;;dysphagia;melena; rectal bleeding; clay colored stool No change in nails,hair,skin No numbness or tingling; tremor No anxiety; depression; panic attacks No temperature intolerance to heat ,cold No bleeding dyscrasias       Objective:   Physical Exam  Positive findings include nonsustained vertical nystagmus with extraocular motion testing. He has no lid lag or proptosis He does have a grade 1/2 systolic murmur. Reflexes at the knees are 1/2+ and equal.  Gen.: Healthy and well-nourished in appearance. Alert, appropriate and cooperative throughout exam. Head: Normocephalic without obvious abnormalities;   Eyes: No corneal or conjunctival inflammation noted. Pupils equal round reactive to light and accommodation. Extraocular motion intact. Ears: External  ear exam reveals no significant lesions or deformities. Canals clear .TMs normal. Hearing is grossly normal bilaterally. Nose: External nasal exam reveals no deformity or inflammation. Nasal mucosa  are pink and moist. No lesions or exudates noted.   Mouth: Oral mucosa and oropharynx reveal no lesions or exudates. Teeth in good repair. Neck: No deformities, masses, or tenderness noted. Range of motion & Thyroid normal Lungs: Normal respiratory effort; chest expands symmetrically. Lungs are clear to auscultation without rales, wheezes, or increased work of breathing. Heart: Normal rate and rhythm. Normal S1 and S2. No gallop, click, or rub.  Abdomen: Bowel sounds normal; abdomen soft and nontender. No masses, organomegaly or hernias noted.                                Musculoskeletal/extremities: No deformity or scoliosis noted of  the thoracic or lumbar spine. No clubbing, cyanosis, edema, or significant extremity  deformity noted. Range of motion normal .Tone & strength normal. Hand joints normal Fingernail  health good. Able to lie down & sit up w/o help. Negative SLR bilaterally Vascular: Carotid, radial artery, dorsalis pedis and  posterior tibial pulses are full and equal. No bruits present. Neurologic: Alert and oriented x3.Gait normal . Skin: Intact without suspicious lesions or rashes. Lymph: No cervical, axillary lymphadenopathy present. Psych: Mood and affect are normal. Normally interactive  Assessment & Plan:  #1 fatigue See orders & AVS

## 2013-11-05 NOTE — Progress Notes (Signed)
Pre visit review using our clinic review tool, if applicable. No additional management support is needed unless otherwise documented below in the visit note. 

## 2014-10-29 ENCOUNTER — Encounter (HOSPITAL_BASED_OUTPATIENT_CLINIC_OR_DEPARTMENT_OTHER): Payer: Self-pay

## 2014-10-29 ENCOUNTER — Emergency Department (HOSPITAL_BASED_OUTPATIENT_CLINIC_OR_DEPARTMENT_OTHER)
Admission: EM | Admit: 2014-10-29 | Discharge: 2014-10-29 | Disposition: A | Discharge status: Home / Self Care | Payer: BC Managed Care – PPO | Attending: Emergency Medicine | Admitting: Emergency Medicine

## 2014-10-29 DIAGNOSIS — R110 Nausea: Secondary | ICD-10-CM | POA: Diagnosis not present

## 2014-10-29 DIAGNOSIS — J45909 Unspecified asthma, uncomplicated: Secondary | ICD-10-CM | POA: Insufficient documentation

## 2014-10-29 DIAGNOSIS — R109 Unspecified abdominal pain: Secondary | ICD-10-CM

## 2014-10-29 DIAGNOSIS — Z720 Tobacco use: Secondary | ICD-10-CM | POA: Insufficient documentation

## 2014-10-29 LAB — BASIC METABOLIC PANEL
Anion gap: 9 (ref 5–15)
BUN: 15 mg/dL (ref 6–20)
CO2: 27 mmol/L (ref 22–32)
CREATININE: 1.14 mg/dL (ref 0.61–1.24)
Calcium: 9.4 mg/dL (ref 8.9–10.3)
Chloride: 103 mmol/L (ref 101–111)
Glucose, Bld: 103 mg/dL — ABNORMAL HIGH (ref 65–99)
Potassium: 4.1 mmol/L (ref 3.5–5.1)
SODIUM: 139 mmol/L (ref 135–145)

## 2014-10-29 LAB — URINALYSIS, ROUTINE W REFLEX MICROSCOPIC
Bilirubin Urine: NEGATIVE
Glucose, UA: NEGATIVE mg/dL
Ketones, ur: NEGATIVE mg/dL
LEUKOCYTES UA: NEGATIVE
NITRITE: NEGATIVE
PH: 6 (ref 5.0–8.0)
PROTEIN: NEGATIVE mg/dL
Specific Gravity, Urine: 1.021 (ref 1.005–1.030)
Urobilinogen, UA: 1 mg/dL (ref 0.0–1.0)

## 2014-10-29 LAB — URINE MICROSCOPIC-ADD ON

## 2014-10-29 MED ORDER — TAMSULOSIN HCL 0.4 MG PO CAPS: 0.4000 mg | ORAL_CAPSULE | Freq: Every day | ORAL | Status: DC

## 2014-10-29 MED ORDER — ONDANSETRON HCL 8 MG PO TABS
4.0000 mg | ORAL_TABLET | Freq: Once | ORAL | Status: AC
  Administered 2014-10-29: 4 mg via ORAL
  Filled 2014-10-29: qty 1

## 2014-10-29 MED ORDER — TRAMADOL HCL 50 MG PO TABS: 50.0000 mg | ORAL_TABLET | Freq: Four times a day (QID) | ORAL | Status: DC | PRN

## 2014-10-29 MED ORDER — TAMSULOSIN HCL 0.4 MG PO CAPS
0.4000 mg | ORAL_CAPSULE | Freq: Once | ORAL | Status: AC
  Administered 2014-10-29: 0.4 mg via ORAL
  Filled 2014-10-29: qty 1

## 2014-10-29 MED ORDER — ONDANSETRON HCL 4 MG PO TABS: 4.0000 mg | ORAL_TABLET | Freq: Four times a day (QID) | ORAL | Status: DC

## 2014-10-29 MED ORDER — TRAMADOL HCL 50 MG PO TABS: 100.0000 mg | ORAL_TABLET | Freq: Once | ORAL | Status: DC

## 2014-10-29 NOTE — ED Notes (Addendum)
Pt refused tramadol at D/C. Patient went home with 3 rx for Zofran, Tramadol, and Flomax. Pt understands to follow up with urology

## 2014-10-29 NOTE — Discharge Instructions (Signed)
Return for any difficulty urinating. Follow up with urology.

## 2014-10-29 NOTE — ED Notes (Signed)
Patient states that he was at work, thought he needed to use the restroom, patient then became flushed and nauseated. Patient is not having any dysuria or polyuria.

## 2014-10-29 NOTE — ED Provider Notes (Signed)
CSN: [id number]     Arrival date & time 10/29/14  1240 History   First MD Initiated Contact with Patient 10/29/14 1249     Chief Complaint  Patient presents with  . Flank Pain     (Consider location/radiation/quality/duration/timing/severity/associated sxs/prior Treatment) Patient is a 29 y.o. male presenting with flank pain. The history is provided by the patient. No language interpreter was used.  Flank Pain Associated symptoms include nausea. Pertinent negatives include no abdominal pain, fever or vomiting.  Mr. Ponzo is a 29 y.o male with a history of asthma who presents for right-sided back pain that began at 8:30 this morning while at work. He states it began as intermittent pain but is now constant and has worsened. He rates it at a 5/10 now. He thought it was a muscle pull from doing work yesterday but then felt it was more serious since he was nauseated. He denies taking anything prior to arrival. He denies any fever, chills, chest pain, shortness of breath, abdominal pain, vomiting, diarrhea, constipation, dysuria, hematuria, urinary frequency. He denies having a previous kidney stone.  Past Medical History  Diagnosis Date  . Allergy   . Asthma     Childhood asthma only   Past Surgical History  Procedure Laterality Date  . Hernia repair     Family History  Problem Relation Age of Onset  . Hypertension Father   . Cancer Paternal Aunt     pancreatic  . Diabetes Paternal Grandmother   . Heart attack Paternal Grandfather     in 108s   Social History  Substance Use Topics  . Smoking status: Current Every Day Smoker    Types: Cigarettes  . Smokeless tobacco: Never Used  . Alcohol Use: Yes     Comment: socially    Review of Systems  Constitutional: Negative for fever.  Gastrointestinal: Positive for nausea. Negative for vomiting and abdominal pain.  Genitourinary: Positive for flank pain. Negative for hematuria and difficulty urinating.  All other systems reviewed and  are negative.     Allergies  Ibuprofen  Home Medications   Prior to Admission medications   Medication Sig Start Date End Date Taking? Authorizing Provider  ondansetron (ZOFRAN) 4 MG tablet Take 1 tablet (4 mg total) by mouth every 6 (six) hours. 10/29/14   Kalel Harty Patel-Mills, PA-C  tamsulosin (FLOMAX) 0.4 MG CAPS capsule Take 1 capsule (0.4 mg total) by mouth daily. 10/29/14   Stepen Prins Patel-Mills, PA-C  traMADol (ULTRAM) 50 MG tablet Take 1 tablet (50 mg total) by mouth every 6 (six) hours as needed. 10/29/14   Brown Dunlap Patel-Mills, PA-C   BP 124/68 mmHg  Pulse 60  Temp(Src) 98.2 F (36.8 C) (Oral)  Resp 20  Ht 5\' 4"  (1.626 m)  Wt 155 lb (70.308 kg)  BMI 26.59 kg/m2  SpO2 100% Physical Exam  Constitutional: He is oriented to person, place, and time. Vital signs are normal. He appears well-developed and well-nourished. No distress.  Well-appearing and in no acute distress.  HENT:  Head: Normocephalic.  Eyes: Conjunctivae are normal.  Neck: Neck supple.  Cardiovascular: Normal rate.   Pulmonary/Chest: Effort normal. No respiratory distress.  Abdominal: Soft. Normal appearance. He exhibits no distension. There is no tenderness. There is CVA tenderness. There is no rebound, no guarding, no tenderness at McBurney's point and negative Murphy's sign.  Mild right CVA tenderness to palpation.  Musculoskeletal: Normal range of motion.  Neurological: He is alert and oriented to person, place, and time.  Skin: Skin  is warm and dry.  Psychiatric: He has a normal mood and affect.    ED Course  Procedures (including critical care time) Labs Review Labs Reviewed  URINALYSIS, ROUTINE W REFLEX MICROSCOPIC (NOT AT [facility]) - Abnormal; Notable for the following:    Hgb urine dipstick SMALL (*)    All other components within normal limits  BASIC METABOLIC PANEL - Abnormal; Notable for the following:    Glucose, Bld 103 (*)    All other components within normal limits  URINE MICROSCOPIC-ADD ON     Imaging Review No results found. I have personally reviewed and evaluated these images and lab results as part of my medical decision-making.   EKG Interpretation None      MDM   Final diagnoses:  Acute right flank pain   Vitals stable. Patient is well-appearing and in no acute distress. Normal kidney function. He does have small hemoglobin in the urine which, along with his symptoms, is indicative for a kidney stone. He refused further imaging and stated he thought it would pass on its own with pain medication.  I prescribed him flomax, tramadol, and zofran.  I discussed return precautions and gave him urology follow.  He verbally agrees with the plan.  Medications  traMADol (ULTRAM) tablet 100 mg (100 mg Oral Not Given 10/29/14 1407)  ondansetron (ZOFRAN) tablet 4 mg (4 mg Oral Given 10/29/14 1316)  tamsulosin (FLOMAX) capsule 0.4 mg (0.4 mg Oral Given 10/29/14 1403)        Catha Gosselin, PA-C 10/29/14 1621  Rolland Porter, MD 11/07/14 [phone]

## 2014-10-29 NOTE — ED Notes (Signed)
Right flank since 830am-nausea and feeling "over heated"-NAD

## 2014-11-05 ENCOUNTER — Emergency Department (HOSPITAL_BASED_OUTPATIENT_CLINIC_OR_DEPARTMENT_OTHER)
Admission: EM | Admit: 2014-11-05 | Discharge: 2014-11-06 | Disposition: A | Discharge status: Home / Self Care | Payer: BC Managed Care – PPO | Attending: Emergency Medicine | Admitting: Emergency Medicine

## 2014-11-05 ENCOUNTER — Encounter (HOSPITAL_BASED_OUTPATIENT_CLINIC_OR_DEPARTMENT_OTHER): Payer: Self-pay | Admitting: Emergency Medicine

## 2014-11-05 ENCOUNTER — Emergency Department (HOSPITAL_BASED_OUTPATIENT_CLINIC_OR_DEPARTMENT_OTHER): Payer: BC Managed Care – PPO

## 2014-11-05 DIAGNOSIS — Q6211 Congenital occlusion of ureteropelvic junction: Secondary | ICD-10-CM

## 2014-11-05 DIAGNOSIS — N23 Unspecified renal colic: Secondary | ICD-10-CM | POA: Diagnosis not present

## 2014-11-05 DIAGNOSIS — J45909 Unspecified asthma, uncomplicated: Secondary | ICD-10-CM | POA: Insufficient documentation

## 2014-11-05 DIAGNOSIS — Z720 Tobacco use: Secondary | ICD-10-CM | POA: Insufficient documentation

## 2014-11-05 DIAGNOSIS — Z79899 Other long term (current) drug therapy: Secondary | ICD-10-CM | POA: Diagnosis not present

## 2014-11-05 DIAGNOSIS — Q620 Congenital hydronephrosis: Secondary | ICD-10-CM | POA: Diagnosis not present

## 2014-11-05 DIAGNOSIS — R109 Unspecified abdominal pain: Secondary | ICD-10-CM | POA: Diagnosis present

## 2014-11-05 LAB — CBC
HEMATOCRIT: 41 % (ref 39.0–52.0)
HEMOGLOBIN: 14.6 g/dL (ref 13.0–17.0)
MCH: 33.7 pg (ref 26.0–34.0)
MCHC: 35.6 g/dL (ref 30.0–36.0)
MCV: 94.7 fL (ref 78.0–100.0)
Platelets: 214 10*3/uL (ref 150–400)
RBC: 4.33 MIL/uL (ref 4.22–5.81)
RDW: 11.3 % — ABNORMAL LOW (ref 11.5–15.5)
WBC: 13.4 10*3/uL — ABNORMAL HIGH (ref 4.0–10.5)

## 2014-11-05 LAB — COMPREHENSIVE METABOLIC PANEL
ALK PHOS: 63 U/L (ref 38–126)
ALT: 11 U/L — AB (ref 17–63)
AST: 21 U/L (ref 15–41)
Albumin: 4.7 g/dL (ref 3.5–5.0)
Anion gap: 10 (ref 5–15)
BUN: 11 mg/dL (ref 6–20)
CALCIUM: 9.6 mg/dL (ref 8.9–10.3)
CO2: 26 mmol/L (ref 22–32)
CREATININE: 1.1 mg/dL (ref 0.61–1.24)
Chloride: 102 mmol/L (ref 101–111)
Glucose, Bld: 102 mg/dL — ABNORMAL HIGH (ref 65–99)
Potassium: 4.4 mmol/L (ref 3.5–5.1)
SODIUM: 138 mmol/L (ref 135–145)
Total Bilirubin: 1.2 mg/dL (ref 0.3–1.2)
Total Protein: 7.7 g/dL (ref 6.5–8.1)

## 2014-11-05 LAB — URINALYSIS, ROUTINE W REFLEX MICROSCOPIC
Glucose, UA: NEGATIVE mg/dL
Hgb urine dipstick: NEGATIVE
Ketones, ur: 40 mg/dL — AB
LEUKOCYTES UA: NEGATIVE
NITRITE: NEGATIVE
PH: 5.5 (ref 5.0–8.0)
Protein, ur: NEGATIVE mg/dL
SPECIFIC GRAVITY, URINE: 1.028 (ref 1.005–1.030)
Urobilinogen, UA: 0.2 mg/dL (ref 0.0–1.0)

## 2014-11-05 MED ORDER — METOCLOPRAMIDE HCL 5 MG/ML IJ SOLN
10.0000 mg | Freq: Once | INTRAMUSCULAR | Status: AC
  Administered 2014-11-05: 10 mg via INTRAVENOUS
  Filled 2014-11-05: qty 2

## 2014-11-05 MED ORDER — MORPHINE SULFATE (PF) 4 MG/ML IV SOLN
4.0000 mg | Freq: Once | INTRAVENOUS | Status: AC
  Administered 2014-11-05: 4 mg via INTRAVENOUS
  Filled 2014-11-05: qty 1

## 2014-11-05 MED ORDER — OXYCODONE-ACETAMINOPHEN 5-325 MG PO TABS: 2.0000 | ORAL_TABLET | ORAL | Status: DC | PRN

## 2014-11-05 NOTE — ED Notes (Signed)
Pa  at bedside. 

## 2014-11-05 NOTE — ED Provider Notes (Signed)
CSN: [id number]     Arrival date & time 11/05/14  1753 History   First MD Initiated Contact with Patient 11/05/14 2048     Chief Complaint  Patient presents with  . Abdominal Pain     (Consider location/radiation/quality/duration/timing/severity/associated sxs/prior Treatment) HPI [name] [name] is a 29 y.o. male who comes in for evaluation of right flank pain. Patient states he was seen in the ED 1 week ago for similar symptoms, was treated for kidney stone. States that he had done well and was essentially symptom-free until today when the pain returned. He reports right-sided flank pain that wraps around from his back. He rates his discomfort as a 6/10. He does report baseline discomfort but does report acute exacerbations that come in waves. Reports associated nausea and vomiting one time today. Denies fevers at home, urinary symptoms, dark or bloody stools. He does report mild constipation. No other aggravating or modifying factors.  Past Medical History  Diagnosis Date  . Allergy   . Asthma     Childhood asthma only   Past Surgical History  Procedure Laterality Date  . Hernia repair     Family History  Problem Relation Age of Onset  . Hypertension Father   . Cancer Paternal Aunt     pancreatic  . Diabetes Paternal Grandmother   . Heart attack Paternal Grandfather     in 26s   Social History  Substance Use Topics  . Smoking status: Current Every Day Smoker    Types: Cigarettes  . Smokeless tobacco: Never Used  . Alcohol Use: Yes     Comment: socially    Review of Systems A 10 point review of systems was completed and was negative except for pertinent positives and negatives as mentioned in the history of present illness     Allergies  Ibuprofen  Home Medications   Prior to Admission medications   Medication Sig Start Date End Date Taking? Authorizing Provider  ondansetron (ZOFRAN) 4 MG tablet Take 1 tablet (4 mg total) by mouth every 6 (six) hours. 10/29/14    Hanna Patel-Mills, PA-C  tamsulosin (FLOMAX) 0.4 MG CAPS capsule Take 1 capsule (0.4 mg total) by mouth daily. 10/29/14   Hanna Patel-Mills, PA-C  traMADol (ULTRAM) 50 MG tablet Take 1 tablet (50 mg total) by mouth every 6 (six) hours as needed. 10/29/14   Hanna Patel-Mills, PA-C   BP 138/87 mmHg  Pulse 64  Temp(Src) 98.7 F (37.1 C) (Oral)  Resp 16  Wt 155 lb (70.308 kg)  SpO2 100% Physical Exam  Constitutional: He is oriented to person, place, and time. He appears well-developed and well-nourished.  HENT:  Head: Normocephalic and atraumatic.  Mouth/Throat: Oropharynx is clear and moist.  Eyes: Conjunctivae are normal. Pupils are equal, round, and reactive to light. Right eye exhibits no discharge. Left eye exhibits no discharge. No scleral icterus.  Neck: Neck supple.  Cardiovascular: Normal rate, regular rhythm and normal heart sounds.   Pulmonary/Chest: Effort normal and breath sounds normal. No respiratory distress. He has no wheezes. He has no rales.  Abdominal: Soft. There is no tenderness.  Mild right-sided CVA tenderness. Abdomen is otherwise soft, nondistended. No peritoneal signs. Negative Murphy's and McBurney's.  Musculoskeletal: He exhibits no tenderness.  Neurological: He is alert and oriented to person, place, and time.  Cranial Nerves II-XII grossly intact  Skin: Skin is warm and dry. No rash noted.  Psychiatric: He has a normal mood and affect.  Nursing note and vitals reviewed.  ED Course  Procedures (including critical care time) Labs Review Labs Reviewed  CBC - Abnormal; Notable for the following:    WBC 13.4 (*)    RDW 11.3 (*)    All other components within normal limits  URINALYSIS, ROUTINE W REFLEX MICROSCOPIC (NOT AT [facility])  COMPREHENSIVE METABOLIC PANEL    Imaging Review No results found. I have personally reviewed and evaluated these images and lab results as part of my medical decision-making.   EKG Interpretation None     Meds given in  ED:  Medications  morphine 4 MG/ML injection 4 mg (not administered)  metoCLOPramide (REGLAN) injection 10 mg (not administered)    New Prescriptions   No medications on file   Filed Vitals:   11/05/14 1809 11/05/14 2041  BP: 126/79 138/87  Pulse: 58 64  Temp: 98.7 F (37.1 C)   TempSrc: Oral   Resp: 16 16  Weight: 155 lb (70.308 kg)   SpO2: 100% 100%    MDM  Vitals stable - WNL -afebrile Pt resting comfortably in ED. PE--benign abdominal exam. Mild right CVA tenderness. Labwork--leukocytosis 13.4. Labs otherwise noncontributory. No evidence of stone, no hemoglobinuria. Imaging--due to patient presenting with similar symptoms and no imaging on file, will obtain CT renal stone.  Care transferred to Dr. Lynelle Doctor for follow-up on CT renal stone study. If no new objective findings, and patient's pain under control, I believe he may be discharged home to follow-up with his PCP or urology. Patient discharged with referral to urology as well as pain medicines. Has Zofran and Flomax at home  Final diagnoses:  Renal colic on right side  Hydronephrosis with ureteropelvic junction (UPJ) obstruction       Joycie Peek, PA-C 11/06/14 1305

## 2014-11-05 NOTE — ED Notes (Signed)
PA at bedside.

## 2014-11-05 NOTE — ED Notes (Signed)
Pt having abdominal pain, states pain has moved from last episode.  Similar symptoms from last er visit.

## 2014-11-05 NOTE — Discharge Instructions (Signed)
Please follow-up with your PCP or with urology for further evaluation management of your symptoms. You may use your pain medicines as needed for severe pain. Return to ED for new or worsening symptoms including fever, chills, back pain, difficulties urinating, abdominal pain, nausea or vomiting.  Kidney Stones Kidney stones (urolithiasis) are deposits that form inside your kidneys. The intense pain is caused by the stone moving through the urinary tract. When the stone moves, the ureter goes into spasm around the stone. The stone is usually passed in the urine.  CAUSES   A disorder that makes certain neck glands produce too much parathyroid hormone (primary hyperparathyroidism).  A buildup of uric acid crystals, similar to gout in your joints.  Narrowing (stricture) of the ureter.  A kidney obstruction present at birth (congenital obstruction).  Previous surgery on the kidney or ureters.  Numerous kidney infections. SYMPTOMS   Feeling sick to your stomach (nauseous).  Throwing up (vomiting).  Blood in the urine (hematuria).  Pain that usually spreads (radiates) to the groin.  Frequency or urgency of urination. DIAGNOSIS   Taking a history and physical exam.  Blood or urine tests.  CT scan.  Occasionally, an examination of the inside of the urinary bladder (cystoscopy) is performed. TREATMENT   Observation.  Increasing your fluid intake.  Extracorporeal shock wave lithotripsy--This is a noninvasive procedure that uses shock waves to break up kidney stones.  Surgery may be needed if you have severe pain or persistent obstruction. There are various surgical procedures. Most of the procedures are performed with the use of small instruments. Only small incisions are needed to accommodate these instruments, so recovery time is minimized. The size, location, and chemical composition are all important variables that will determine the proper choice of action for you. Talk to  your health care provider to better understand your situation so that you will minimize the risk of injury to yourself and your kidney.  HOME CARE INSTRUCTIONS   Drink enough water and fluids to keep your urine clear or pale yellow. This will help you to pass the stone or stone fragments.  Strain all urine through the provided strainer. Keep all particulate matter and stones for your health care provider to see. The stone causing the pain may be as small as a grain of salt. It is very important to use the strainer each and every time you pass your urine. The collection of your stone will allow your health care provider to analyze it and verify that a stone has actually passed. The stone analysis will often identify what you can do to reduce the incidence of recurrences.  Only take over-the-counter or prescription medicines for pain, discomfort, or fever as directed by your health care provider.  Make a follow-up appointment with your health care provider as directed.  Get follow-up X-rays if required. The absence of pain does not always mean that the stone has passed. It may have only stopped moving. If the urine remains completely obstructed, it can cause loss of kidney function or even complete destruction of the kidney. It is your responsibility to make sure X-rays and follow-ups are completed. Ultrasounds of the kidney can show blockages and the status of the kidney. Ultrasounds are not associated with any radiation and can be performed easily in a matter of minutes. SEEK MEDICAL CARE IF:  You experience pain that is progressive and unresponsive to any pain medicine you have been prescribed. SEEK IMMEDIATE MEDICAL CARE IF:   Pain cannot be  controlled with the prescribed medicine.  You have a fever or shaking chills.  The severity or intensity of pain increases over 18 hours and is not relieved by pain medicine.  You develop a new onset of abdominal pain.  You feel faint or pass  out.  You are unable to urinate. MAKE SURE YOU:   Understand these instructions.  Will watch your condition.  Will get help right away if you are not doing well or get worse. Document Released: 02/28/2005 Document Revised: 10/31/2012 Document Reviewed: 08/01/2012 [facility] Patient Information 2015 [place], [place]. This information is not intended to replace advice given to you by your health care provider. Make sure you discuss any questions you have with your health care provider.  Ureteral Colic (Kidney Stones) Ureteral colic is the result of a condition when kidney stones form inside the kidney. Once kidney stones are formed they may move into the tube that connects the kidney with the bladder (ureter). If this occurs, this condition may cause pain (colic) in the ureter.  CAUSES  Pain is caused by stone movement in the ureter and the obstruction caused by the stone. SYMPTOMS  The pain comes and goes as the ureter contracts around the stone. The pain is usually intense, sharp, and stabbing in character. The location of the pain may move as the stone moves through the ureter. When the stone is near the kidney the pain is usually located in the back and radiates to the belly (abdomen). When the stone is ready to pass into the bladder the pain is often located in the lower abdomen on the side the stone is located. At this location, the symptoms may mimic those of a urinary tract infection with urinary frequency. Once the stone is located here it often passes into the bladder and the pain disappears completely. TREATMENT   Your caregiver will provide you with medicine for pain relief.  You may require specialized follow-up X-rays.  The absence of pain does not always mean that the stone has passed. It may have just stopped moving. If the urine remains completely obstructed, it can cause loss of kidney function or even complete destruction of the involved kidney. It is your responsibility and in  your interest that X-rays and follow-ups as suggested by your caregiver are completed. Relief of pain without passage of the stone can be associated with severe damage to the kidney, including loss of kidney function on that side.  If your stone does not pass on its own, additional measures may be taken by your caregiver to ensure its removal. HOME CARE INSTRUCTIONS   Increase your fluid intake. Water is the preferred fluid since juices containing vitamin C may acidify the urine making it less likely for certain stones (uric acid stones) to pass.  Strain all urine. A strainer will be provided. Keep all particulate matter or stones for your caregiver to inspect.  Take your pain medicine as directed.  Make a follow-up appointment with your caregiver as directed.  Remember that the goal is passage of your stone. The absence of pain does not mean the stone is gone. Follow your caregiver's instructions.  Only take over-the-counter or prescription medicines for pain, discomfort, or fever as directed by your caregiver. SEEK MEDICAL CARE IF:   Pain cannot be controlled with the prescribed medicine.  You have a fever.  Pain continues for longer than your caregiver advises it should.  There is a change in the pain, and you develop chest discomfort or  constant abdominal pain.  You feel faint or pass out. MAKE SURE YOU:   Understand these instructions.  Will watch your condition.  Will get help right away if you are not doing well or get worse. Document Released: 12/08/2004 Document Revised: 06/25/2012 Document Reviewed: 08/25/2010 [facility] Patient Information 2015 [place], [place]. This information is not intended to replace advice given to you by your health care provider. Make sure you discuss any questions you have with your health care provider.

## 2014-11-06 NOTE — ED Provider Notes (Signed)
Medical screening examination/treatment/procedure(s) were conducted as a shared visit with non-physician practitioner(s) and myself.  I personally evaluated the patient during the encounter.  I discussed the CT scan findings with the patient. 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 The patient has plans to follow up with urology.. CT images and findings were discussed with the patient and his family member.  [name], MD 11/06/14 [phone]

## 2014-11-10 ENCOUNTER — Other Ambulatory Visit: Payer: Self-pay | Admitting: Urology

## 2014-11-10 DIAGNOSIS — N1330 Unspecified hydronephrosis: Secondary | ICD-10-CM

## 2014-11-14 ENCOUNTER — Ambulatory Visit (HOSPITAL_COMMUNITY)
Admission: RE | Admit: 2014-11-14 | Discharge: 2014-11-14 | Disposition: A | Discharge status: Home / Self Care | Payer: BC Managed Care – PPO | Source: Ambulatory Visit | Attending: Urology | Admitting: Urology

## 2014-11-14 DIAGNOSIS — N1330 Unspecified hydronephrosis: Secondary | ICD-10-CM | POA: Insufficient documentation

## 2014-11-14 MED ORDER — TECHNETIUM TC 99M MERTIATIDE
15.5000 | Freq: Once | INTRAVENOUS | Status: AC | PRN
  Administered 2014-11-14: 16 via INTRAVENOUS

## 2014-11-14 MED ORDER — FUROSEMIDE 10 MG/ML IJ SOLN
INTRAMUSCULAR | Status: AC
  Filled 2014-11-14: qty 4

## 2014-11-14 MED ORDER — FUROSEMIDE 10 MG/ML IJ SOLN
35.0000 mg | Freq: Once | INTRAMUSCULAR | Status: AC
  Administered 2014-11-14: 35 mg via INTRAVENOUS

## 2014-11-21 ENCOUNTER — Other Ambulatory Visit: Payer: Self-pay | Admitting: Urology

## 2014-12-26 ENCOUNTER — Encounter (HOSPITAL_COMMUNITY)
Admission: RE | Admit: 2014-12-26 | Discharge: 2014-12-26 | Disposition: A | Discharge status: Home / Self Care | Payer: BC Managed Care – PPO | Source: Ambulatory Visit | Attending: Urology | Admitting: Urology

## 2014-12-26 ENCOUNTER — Encounter (HOSPITAL_COMMUNITY): Payer: Self-pay

## 2014-12-26 DIAGNOSIS — N261 Atrophy of kidney (terminal): Secondary | ICD-10-CM | POA: Insufficient documentation

## 2014-12-26 DIAGNOSIS — Z01818 Encounter for other preprocedural examination: Secondary | ICD-10-CM | POA: Insufficient documentation

## 2014-12-26 HISTORY — DX: Pneumonia, unspecified organism: J18.9

## 2014-12-26 HISTORY — DX: Cardiac murmur, unspecified: R01.1

## 2014-12-26 LAB — BASIC METABOLIC PANEL
ANION GAP: 6 (ref 5–15)
BUN: 10 mg/dL (ref 6–20)
CALCIUM: 9.2 mg/dL (ref 8.9–10.3)
CO2: 29 mmol/L (ref 22–32)
Chloride: 105 mmol/L (ref 101–111)
Creatinine, Ser: 0.92 mg/dL (ref 0.61–1.24)
GFR calc Af Amer: >60 mL/min (ref >60)
GLUCOSE: 98 mg/dL (ref 65–99)
Potassium: 4.9 mmol/L (ref 3.5–5.1)
Sodium: 140 mmol/L (ref 135–145)

## 2014-12-26 LAB — CBC
HEMATOCRIT: 40.4 % (ref 39.0–52.0)
Hemoglobin: 13.9 g/dL (ref 13.0–17.0)
MCH: 32.7 pg (ref 26.0–34.0)
MCHC: 34.4 g/dL (ref 30.0–36.0)
MCV: 95.1 fL (ref 78.0–100.0)
PLATELETS: 179 10*3/uL (ref 150–400)
RBC: 4.25 MIL/uL (ref 4.22–5.81)
RDW: 11.8 % (ref 11.5–15.5)
WBC: 5.7 10*3/uL (ref 4.0–10.5)

## 2014-12-26 LAB — ABO/RH: ABO/RH(D): O POS

## 2014-12-26 NOTE — Patient Instructions (Signed)
[name] [name]  12/26/2014   Your procedure is scheduled on: Friday January 02, 2015  [facility]  [place] to 3rd floor to  Short Stay Center at 10:45 AM.  Call this number if you have problems the morning of surgery [phone]   Remember: ONLY 1 PERSON MAY GO WITH YOU TO SHORT STAY TO GET  READY MORNING OF YOUR SURGERY.  Do not eat food After Midnight. May take clear liquids till 6:45 am day of surgery then nothing by mouth.              FOLLOW MD INSTRUCTION IN REGARDS TO BOWEL PREPARATION PRIOR TO SURGERY DATE.      Take these medicines the morning of surgery with A SIP OF WATER: OXYCODONE-ACETAMINOPHEN IF NEEDED                                You may not have any metal on your body including hair pins and              piercings  Do not wear jewelry, lotions, powders or colognes, deodorant                          Men may shave face and neck.   Do not bring valuables to the hospital. [facility] IS NOT             RESPONSIBLE   FOR VALUABLES.  Contacts, dentures or bridgework may not be worn into surgery.  Leave suitcase in the car. After surgery it may be brought to your room.              Please read over the following fact sheets you were given: BLOOD TRANSFUSION INFORMATION SHEET _____________________________________________________________________             [facility] - Preparing for [facility] Before surgery, you can play an important role.  Because skin is not sterile, your skin needs to be as free of germs as possible.  You can reduce the number of germs on your skin by washing with CHG (chlorahexidine gluconate) soap before surgery.  CHG is an antiseptic cleaner which kills germs and bonds with the skin to continue killing germs even after washing. Please DO NOT use if you have an allergy to CHG or antibacterial soaps.  If your skin becomes reddened/irritated stop using the CHG and inform your nurse when  you arrive at Short Stay. Do not shave (including legs and underarms) for at least 48 hours prior to the first CHG shower.  You may shave your face/neck. Please follow these instructions carefully:  1.  Shower with CHG Soap the night before surgery and the  morning of Surgery.  2.  If you choose to wash your hair, wash your hair first as usual with your  normal  shampoo.  3.  After you shampoo, rinse your hair and body thoroughly to remove the  shampoo.                           4.  Use CHG as you would any other liquid soap.  You can apply chg directly  to the skin and wash  Gently with a scrungie or clean washcloth.  5.  Apply the CHG Soap to your body ONLY FROM THE NECK DOWN.   Do not use on face/ open                           Wound or open sores. Avoid contact with eyes, ears mouth and genitals (private parts).                       Wash face,  Genitals (private parts) with your normal soap.             6.  Wash thoroughly, paying special attention to the area where your surgery  will be performed.  7.  Thoroughly rinse your body with warm water from the neck down.  8.  DO NOT shower/wash with your normal soap after using and rinsing off  the CHG Soap.                9.  Pat yourself dry with a clean towel.            10.  Wear clean pajamas.            11.  Place clean sheets on your bed the night of your first shower and do not  sleep with pets. Day of Surgery : Do not apply any lotions/deodorants the morning of surgery.  Please wear clean clothes to the hospital/surgery center.  FAILURE TO FOLLOW THESE INSTRUCTIONS MAY RESULT IN THE CANCELLATION OF YOUR SURGERY PATIENT SIGNATURE_________________________________  NURSE SIGNATURE__________________________________  ________________________________________________________________________    CLEAR LIQUID DIET   Foods Allowed                                                                     Foods  Excluded  Coffee and tea, regular and decaf                             liquids that you cannot  Plain Jell-O in any flavor                                             see through such as: Fruit ices (not with fruit pulp)                                     milk, soups, orange juice  Iced Popsicles                                    All solid food Carbonated beverages, regular and diet                                    Cranberry, grape and apple juices Sports drinks like Gatorade Lightly seasoned clear broth or consume(fat free) Sugar, honey syrup  Sample Menu Breakfast                                Lunch                                     Supper Cranberry juice                    Beef broth                            Chicken broth Jell-O                                     Grape juice                           Apple juice Coffee or tea                        Jell-O                                      Popsicle                                                Coffee or tea                        Coffee or tea  _____________________________________________________________________   WHAT IS A BLOOD TRANSFUSION? Blood Transfusion Information  A transfusion is the replacement of blood or some of its parts. Blood is made up of multiple cells which provide different functions.  Red blood cells carry oxygen and are used for blood loss replacement.  White blood cells fight against infection.  Platelets control bleeding.  Plasma helps clot blood.  Other blood products are available for specialized needs, such as hemophilia or other clotting disorders. BEFORE THE TRANSFUSION  Who gives blood for transfusions?   Healthy volunteers who are fully evaluated to make sure their blood is safe. This is blood bank blood. Transfusion therapy is the safest it has ever been in the practice of medicine. Before blood is taken from a donor, a complete history is taken to make sure that person has no  history of diseases nor engages in risky social behavior (examples are intravenous drug use or sexual activity with multiple partners). The donor's travel history is screened to minimize risk of transmitting infections, such as malaria. The donated blood is tested for signs of infectious diseases, such as HIV and hepatitis. The blood is then tested to be sure it is compatible with you in order to minimize the chance of a transfusion reaction. If you or a relative donates blood, this is often done in anticipation of surgery and is not appropriate for emergency situations. It takes many days to process the donated blood. RISKS AND COMPLICATIONS Although transfusion therapy is very safe and saves many lives, the main dangers of transfusion include:  Getting an infectious disease.  Developing a transfusion reaction. This is an allergic reaction to something in the blood you were given. Every precaution is taken to prevent this. The decision to have a blood transfusion has been considered carefully by your caregiver before blood is given. Blood is not given unless the benefits outweigh the risks. AFTER THE TRANSFUSION  Right after receiving a blood transfusion, you will usually feel much better and more energetic. This is especially true if your red blood cells have gotten low (anemic). The transfusion raises the level of the red blood cells which carry oxygen, and this usually causes an energy increase.  The nurse administering the transfusion will monitor you carefully for complications. HOME CARE INSTRUCTIONS  No special instructions are needed after a transfusion. You may find your energy is better. Speak with your caregiver about any limitations on activity for underlying diseases you may have. SEEK MEDICAL CARE IF:   Your condition is not improving after your transfusion.  You develop redness or irritation at the intravenous (IV) site. SEEK IMMEDIATE MEDICAL CARE IF:  Any of the following  symptoms occur over the next 12 hours:  Shaking chills.  You have a temperature by mouth above 102 F (38.9 C), not controlled by medicine.  Chest, back, or muscle pain.  People around you feel you are not acting correctly or are confused.  Shortness of breath or difficulty breathing.  Dizziness and fainting.  You get a rash or develop hives.  You have a decrease in urine output.  Your urine turns a dark color or changes to pink, red, or brown. Any of the following symptoms occur over the next 10 days:  You have a temperature by mouth above 102 F (38.9 C), not controlled by medicine.  Shortness of breath.  Weakness after normal activity.  The white part of the eye turns yellow (jaundice).  You have a decrease in the amount of urine or are urinating less often.  Your urine turns a dark color or changes to pink, red, or brown. Document Released: 02/26/2000 Document Revised: 05/23/2011 Document Reviewed: 10/15/2007 [facility] Patient Information 2014 [place], [place].  _______________________________________________________________________

## 2015-01-02 ENCOUNTER — Inpatient Hospital Stay (HOSPITAL_COMMUNITY)
Admission: RE | Admit: 2015-01-02 | Discharge: 2015-01-04 | DRG: 661 | Disposition: A | Discharge status: Home / Self Care | Payer: BC Managed Care – PPO | Source: Ambulatory Visit | Attending: Urology | Admitting: Urology

## 2015-01-02 ENCOUNTER — Encounter (HOSPITAL_COMMUNITY): Payer: Self-pay | Admitting: *Deleted

## 2015-01-02 ENCOUNTER — Inpatient Hospital Stay (HOSPITAL_COMMUNITY): Payer: BC Managed Care – PPO | Admitting: Certified Registered Nurse Anesthetist

## 2015-01-02 ENCOUNTER — Encounter (HOSPITAL_COMMUNITY)
Admission: RE | Disposition: A | Discharge status: Home / Self Care | Payer: Self-pay | Source: Ambulatory Visit | Attending: Urology

## 2015-01-02 DIAGNOSIS — F17210 Nicotine dependence, cigarettes, uncomplicated: Secondary | ICD-10-CM | POA: Diagnosis present

## 2015-01-02 DIAGNOSIS — Z8249 Family history of ischemic heart disease and other diseases of the circulatory system: Secondary | ICD-10-CM

## 2015-01-02 DIAGNOSIS — Z833 Family history of diabetes mellitus: Secondary | ICD-10-CM | POA: Diagnosis not present

## 2015-01-02 DIAGNOSIS — Z01812 Encounter for preprocedural laboratory examination: Secondary | ICD-10-CM | POA: Diagnosis not present

## 2015-01-02 DIAGNOSIS — N130 Hydronephrosis with ureteropelvic junction obstruction: Secondary | ICD-10-CM | POA: Diagnosis present

## 2015-01-02 DIAGNOSIS — Z809 Family history of malignant neoplasm, unspecified: Secondary | ICD-10-CM

## 2015-01-02 DIAGNOSIS — N261 Atrophy of kidney (terminal): Secondary | ICD-10-CM | POA: Diagnosis present

## 2015-01-02 HISTORY — PX: ROBOT ASSISTED LAPAROSCOPIC NEPHRECTOMY: SHX5140

## 2015-01-02 LAB — TYPE AND SCREEN
ABO/RH(D): O POS
Antibody Screen: NEGATIVE

## 2015-01-02 LAB — HEMOGLOBIN AND HEMATOCRIT, BLOOD
HEMATOCRIT: 38 % — AB (ref 39.0–52.0)
HEMOGLOBIN: 13.2 g/dL (ref 13.0–17.0)

## 2015-01-02 SURGERY — ROBOTIC ASSISTED LAPAROSCOPIC NEPHRECTOMY
Anesthesia: General | Laterality: Right

## 2015-01-02 MED ORDER — CEFAZOLIN SODIUM-DEXTROSE 2-3 GM-% IV SOLR
INTRAVENOUS | Status: AC
  Filled 2015-01-02: qty 50

## 2015-01-02 MED ORDER — GLYCOPYRROLATE 0.2 MG/ML IJ SOLN
INTRAMUSCULAR | Status: AC
  Filled 2015-01-02: qty 3

## 2015-01-02 MED ORDER — ONDANSETRON HCL 4 MG/2ML IJ SOLN
INTRAMUSCULAR | Status: AC
  Filled 2015-01-02: qty 2

## 2015-01-02 MED ORDER — HYDROMORPHONE HCL 2 MG/ML IJ SOLN
INTRAMUSCULAR | Status: AC
  Filled 2015-01-02: qty 1

## 2015-01-02 MED ORDER — HYDROMORPHONE HCL 1 MG/ML IJ SOLN
INTRAMUSCULAR | Status: DC | PRN
  Administered 2015-01-02: 0.5 mg via INTRAVENOUS
  Administered 2015-01-02: 1 mg via INTRAVENOUS
  Administered 2015-01-02: 0.5 mg via INTRAVENOUS

## 2015-01-02 MED ORDER — ONDANSETRON HCL 4 MG/2ML IJ SOLN
INTRAMUSCULAR | Status: DC | PRN
  Administered 2015-01-02: 4 mg via INTRAVENOUS

## 2015-01-02 MED ORDER — PROPOFOL 10 MG/ML IV BOLUS
INTRAVENOUS | Status: DC | PRN
  Administered 2015-01-02: 200 mg via INTRAVENOUS

## 2015-01-02 MED ORDER — CEFAZOLIN SODIUM-DEXTROSE 2-3 GM-% IV SOLR
2.0000 g | INTRAVENOUS | Status: AC
  Administered 2015-01-02: 2 g via INTRAVENOUS

## 2015-01-02 MED ORDER — FENTANYL CITRATE (PF) 100 MCG/2ML IJ SOLN
INTRAMUSCULAR | Status: DC | PRN
  Administered 2015-01-02 (×5): 50 ug via INTRAVENOUS

## 2015-01-02 MED ORDER — NEOSTIGMINE METHYLSULFATE 10 MG/10ML IV SOLN
INTRAVENOUS | Status: AC
  Filled 2015-01-02: qty 1

## 2015-01-02 MED ORDER — HYDROMORPHONE HCL 1 MG/ML IJ SOLN
0.2500 mg | INTRAMUSCULAR | Status: DC | PRN
  Administered 2015-01-02 (×4): 0.5 mg via INTRAVENOUS

## 2015-01-02 MED ORDER — ONDANSETRON HCL 4 MG/2ML IJ SOLN: 4.0000 mg | INTRAMUSCULAR | Status: DC | PRN

## 2015-01-02 MED ORDER — OXYCODONE HCL 5 MG PO TABS
5.0000 mg | ORAL_TABLET | ORAL | Status: DC | PRN
  Administered 2015-01-03 (×2): 5 mg via ORAL
  Filled 2015-01-02 (×2): qty 1

## 2015-01-02 MED ORDER — ARTIFICIAL TEARS OP OINT
TOPICAL_OINTMENT | OPHTHALMIC | Status: AC
  Filled 2015-01-02: qty 3.5

## 2015-01-02 MED ORDER — ROCURONIUM BROMIDE 100 MG/10ML IV SOLN
INTRAVENOUS | Status: DC | PRN
  Administered 2015-01-02 (×3): 10 mg via INTRAVENOUS
  Administered 2015-01-02: 50 mg via INTRAVENOUS

## 2015-01-02 MED ORDER — HYDROMORPHONE HCL 1 MG/ML IJ SOLN
INTRAMUSCULAR | Status: AC
  Filled 2015-01-02: qty 2

## 2015-01-02 MED ORDER — SUGAMMADEX SODIUM 200 MG/2ML IV SOLN
INTRAVENOUS | Status: DC | PRN
  Administered 2015-01-02: 200 mg via INTRAVENOUS

## 2015-01-02 MED ORDER — EPHEDRINE SULFATE 50 MG/ML IJ SOLN
INTRAMUSCULAR | Status: DC | PRN
  Administered 2015-01-02: 15 mg via INTRAVENOUS

## 2015-01-02 MED ORDER — DEXAMETHASONE SODIUM PHOSPHATE 10 MG/ML IJ SOLN
INTRAMUSCULAR | Status: DC | PRN
  Administered 2015-01-02: 10 mg via INTRAVENOUS

## 2015-01-02 MED ORDER — LACTATED RINGERS IV SOLN: INTRAVENOUS | Status: DC

## 2015-01-02 MED ORDER — LIDOCAINE HCL (CARDIAC) 20 MG/ML IV SOLN
INTRAVENOUS | Status: AC
  Filled 2015-01-02: qty 5

## 2015-01-02 MED ORDER — LIDOCAINE HCL (CARDIAC) 20 MG/ML IV SOLN
INTRAVENOUS | Status: DC | PRN
  Administered 2015-01-02: 100 mg via INTRAVENOUS

## 2015-01-02 MED ORDER — SODIUM CHLORIDE 0.9 % IJ SOLN
INTRAMUSCULAR | Status: AC
  Filled 2015-01-02: qty 20

## 2015-01-02 MED ORDER — DEXTROSE-NACL 5-0.45 % IV SOLN
INTRAVENOUS | Status: DC
  Administered 2015-01-02 – 2015-01-04 (×4): via INTRAVENOUS

## 2015-01-02 MED ORDER — ROCURONIUM BROMIDE 100 MG/10ML IV SOLN
INTRAVENOUS | Status: AC
  Filled 2015-01-02: qty 1

## 2015-01-02 MED ORDER — STERILE WATER FOR IRRIGATION IR SOLN
Status: DC | PRN
Start: 2015-01-02 — End: 2015-01-02
  Administered 2015-01-02: 1000 mL

## 2015-01-02 MED ORDER — FENTANYL CITRATE (PF) 250 MCG/5ML IJ SOLN
INTRAMUSCULAR | Status: AC
  Filled 2015-01-02: qty 25

## 2015-01-02 MED ORDER — ACETAMINOPHEN 500 MG PO TABS
1000.0000 mg | ORAL_TABLET | Freq: Four times a day (QID) | ORAL | Status: AC
Start: 2015-01-02 — End: 2015-01-03
  Administered 2015-01-02 – 2015-01-03 (×4): 1000 mg via ORAL
  Filled 2015-01-02 (×4): qty 2

## 2015-01-02 MED ORDER — SUCCINYLCHOLINE CHLORIDE 20 MG/ML IJ SOLN
INTRAMUSCULAR | Status: DC | PRN
  Administered 2015-01-02: 100 mg via INTRAVENOUS

## 2015-01-02 MED ORDER — LACTATED RINGERS IR SOLN
Status: DC | PRN
  Administered 2015-01-02: 1000 mL

## 2015-01-02 MED ORDER — PROPOFOL 10 MG/ML IV BOLUS
INTRAVENOUS | Status: AC
  Filled 2015-01-02: qty 20

## 2015-01-02 MED ORDER — BUPIVACAINE LIPOSOME 1.3 % IJ SUSP
20.0000 mL | Freq: Once | INTRAMUSCULAR | Status: AC
  Administered 2015-01-02: 20 mL
  Filled 2015-01-02: qty 20

## 2015-01-02 MED ORDER — MIDAZOLAM HCL 2 MG/2ML IJ SOLN
INTRAMUSCULAR | Status: AC
  Filled 2015-01-02: qty 4

## 2015-01-02 MED ORDER — LACTATED RINGERS IV SOLN
INTRAVENOUS | Status: DC | PRN
  Administered 2015-01-02 (×3): via INTRAVENOUS

## 2015-01-02 MED ORDER — HYDROMORPHONE HCL 1 MG/ML IJ SOLN
0.5000 mg | INTRAMUSCULAR | Status: DC | PRN
  Administered 2015-01-02 – 2015-01-04 (×9): 1 mg via INTRAVENOUS
  Filled 2015-01-02 (×9): qty 1

## 2015-01-02 MED ORDER — OXYCODONE-ACETAMINOPHEN 5-325 MG PO TABS: 1.0000 | ORAL_TABLET | Freq: Four times a day (QID) | ORAL | Status: DC | PRN

## 2015-01-02 MED ORDER — MIDAZOLAM HCL 5 MG/5ML IJ SOLN
INTRAMUSCULAR | Status: DC | PRN
  Administered 2015-01-02: 2 mg via INTRAVENOUS

## 2015-01-02 SURGICAL SUPPLY — 54 items
CHLORAPREP W/TINT 26ML (MISCELLANEOUS) ×3 IMPLANT
CLIP LIGATING HEM O LOK PURPLE (MISCELLANEOUS) ×2 IMPLANT
CLIP LIGATING HEMO LOK XL GOLD (MISCELLANEOUS) ×3 IMPLANT
CLIP LIGATING HEMO O LOK GREEN (MISCELLANEOUS) ×2 IMPLANT
CORDS BIPOLAR (ELECTRODE) ×1 IMPLANT
COVER SURGICAL LIGHT HANDLE (MISCELLANEOUS) ×1 IMPLANT
COVER TIP SHEARS 8 DVNC (MISCELLANEOUS) ×1 IMPLANT
COVER TIP SHEARS 8MM DA VINCI (MISCELLANEOUS) ×2
CUTTER ECHEON FLEX ENDO 45 340 (ENDOMECHANICALS) ×2 IMPLANT
DECANTER SPIKE VIAL GLASS SM (MISCELLANEOUS) ×3 IMPLANT
DRAIN CHANNEL 15F RND FF 3/16 (WOUND CARE) IMPLANT
DRAPE INCISE IOBAN 66X45 STRL (DRAPES) ×3 IMPLANT
DRAPE LAPAROSCOPIC ABDOMINAL (DRAPES) ×1 IMPLANT
DRAPE SHEET LG 3/4 BI-LAMINATE (DRAPES) ×3 IMPLANT
DRAPE WARM FLUID 44X44 (DRAPE) ×1 IMPLANT
ELECT PENCIL ROCKER SW 15FT (MISCELLANEOUS) ×3 IMPLANT
ELECT REM PT RETURN 9FT ADLT (ELECTROSURGICAL) ×6
ELECTRODE REM PT RTRN 9FT ADLT (ELECTROSURGICAL) ×2 IMPLANT
EVACUATOR SILICONE 100CC (DRAIN) IMPLANT
GLOVE BIO SURGEON STRL SZ 6.5 (GLOVE) ×5 IMPLANT
GLOVE BIO SURGEON STRL SZ7.5 (GLOVE) ×2 IMPLANT
GLOVE BIO SURGEONS STRL SZ 6.5 (GLOVE) ×4
GLOVE BIOGEL M STRL SZ7.5 (GLOVE) ×6 IMPLANT
GLOVE BIOGEL PI IND STRL 6.5 (GLOVE) IMPLANT
GLOVE BIOGEL PI INDICATOR 6.5 (GLOVE) ×4
GOWN STRL REUS W/ TWL XL LVL3 (GOWN DISPOSABLE) IMPLANT
GOWN STRL REUS W/TWL LRG LVL3 (GOWN DISPOSABLE) ×9 IMPLANT
GOWN STRL REUS W/TWL XL LVL3 (GOWN DISPOSABLE) ×6
KIT ACCESSORY DA VINCI DISP (KITS) ×2
KIT ACCESSORY DVNC DISP (KITS) ×1 IMPLANT
KIT BASIN OR (CUSTOM PROCEDURE TRAY) ×3 IMPLANT
LIQUID BAND (GAUZE/BANDAGES/DRESSINGS) ×6 IMPLANT
LOOP VESSEL MAXI BLUE (MISCELLANEOUS) ×3 IMPLANT
NDL INSUFFLATION 14GA 120MM (NEEDLE) ×1 IMPLANT
NEEDLE INSUFFLATION 14GA 120MM (NEEDLE) ×3 IMPLANT
POSITIONER SURGICAL ARM (MISCELLANEOUS) ×6 IMPLANT
POUCH ENDO CATCH II 15MM (MISCELLANEOUS) ×3 IMPLANT
RELOAD WH ECHELON 45 (STAPLE) ×8 IMPLANT
SET TUBE IRRIG SUCTION NO TIP (IRRIGATION / IRRIGATOR) ×3 IMPLANT
SOLUTION ELECTROLUBE (MISCELLANEOUS) ×3 IMPLANT
SPONGE LAP 4X18 X RAY DECT (DISPOSABLE) ×3 IMPLANT
SUT ETHILON 3 0 PS 1 (SUTURE) IMPLANT
SUT MNCRL AB 4-0 PS2 18 (SUTURE) ×6 IMPLANT
SUT PDS AB 1 CT1 27 (SUTURE) ×12 IMPLANT
SUT VICRYL 0 UR6 27IN ABS (SUTURE) ×6 IMPLANT
TAPE CLOTH 4X10 WHT NS (GAUZE/BANDAGES/DRESSINGS) ×3 IMPLANT
TOWEL OR NON WOVEN STRL DISP B (DISPOSABLE) ×6 IMPLANT
TRAY FOLEY W/METER SILVER 14FR (SET/KITS/TRAYS/PACK) ×3 IMPLANT
TRAY FOLEY W/METER SILVER 16FR (SET/KITS/TRAYS/PACK) ×1 IMPLANT
TRAY LAPAROSCOPIC (CUSTOM PROCEDURE TRAY) ×3 IMPLANT
TROCAR 12M 150ML BLUNT (TROCAR) ×1 IMPLANT
TROCAR BLADELESS OPT 5 100 (ENDOMECHANICALS) ×3 IMPLANT
TROCAR XCEL 12X100 BLDLESS (ENDOMECHANICALS) ×3 IMPLANT
WATER STERILE IRR 1500ML POUR (IV SOLUTION) ×2 IMPLANT

## 2015-01-02 NOTE — Anesthesia Preprocedure Evaluation (Signed)
Anesthesia Evaluation  Patient identified by MRN, date of birth, ID band Patient awake    Reviewed: Allergy & Precautions, H&P , NPO status , Patient's Chart, lab work & pertinent test results  Airway Mallampati: II  TM Distance: >3 FB Neck ROM: full    Dental no notable dental hx. (+) Dental Advisory Given, Teeth Intact   Pulmonary Current Smoker,    Pulmonary exam normal breath sounds clear to auscultation       Cardiovascular Exercise Tolerance: Good negative cardio ROS Normal cardiovascular exam Rhythm:regular Rate:Normal     Neuro/Psych negative neurological ROS  negative psych ROS   GI/Hepatic negative GI ROS, Neg liver ROS,   Endo/Other  negative endocrine ROS  Renal/GU negative Renal ROS  negative genitourinary   Musculoskeletal   Abdominal   Peds  Hematology negative hematology ROS (+)   Anesthesia Other Findings   Reproductive/Obstetrics negative OB ROS                             Anesthesia Physical Anesthesia Plan  ASA: II  Anesthesia Plan: General   Post-op Pain Management:    Induction: Intravenous  Airway Management Planned: Oral ETT  Additional Equipment:   Intra-op Plan:   Post-operative Plan: Extubation in OR  Informed Consent: I have reviewed the patients History and Physical, chart, labs and discussed the procedure including the risks, benefits and alternatives for the proposed anesthesia with the patient or authorized representative who has indicated his/her understanding and acceptance.   Dental Advisory Given  Plan Discussed with: CRNA and Surgeon  Anesthesia Plan Comments:         Anesthesia Quick Evaluation

## 2015-01-02 NOTE — Brief Op Note (Signed)
01/02/2015  3:12 PM  PATIENT:  [name] [name]  29 y.o. male  PRE-OPERATIVE DIAGNOSIS:  ATROPHIC RIGHT KIDNEY  POST-OPERATIVE DIAGNOSIS:  ATROPHIC RIGHT KIDNEY  PROCEDURE:  Procedure(s): ROBOTIC ASSISTED LAPAROSCOPIC NEPHRECTOMY (Right)  SURGEON:  Surgeon(s) and Role:    * Sebastian Acheheodore Sharniece Gibbon, MD - Primary  PHYSICIAN ASSISTANT:   ASSISTANTS: 1 - Flo ShanksAmanda Dancey, PA; 2 - Greggory BrandyPeter Greene MD   ANESTHESIA:   general  EBL:  Total I/O In: 2000 [I.V.:2000] Out: 150 [Urine:100; Blood:50]  BLOOD ADMINISTERED:none  DRAINS: foley to gravity   LOCAL MEDICATIONS USED:  MARCAINE     SPECIMEN:  Source of Specimen:  Right atrophic kidney  DISPOSITION OF SPECIMEN:  PATHOLOGY  COUNTS:  YES  TOURNIQUET:  * No tourniquets in log *  DICTATION: .Other Dictation: Dictation Number [id number]  PLAN OF CARE: Admit to inpatient   PATIENT DISPOSITION:  PACU - hemodynamically stable.   Delay start of Pharmacological VTE agent (>24hrs) due to surgical blood loss or risk of bleeding: yes

## 2015-01-02 NOTE — Anesthesia Postprocedure Evaluation (Signed)
  Anesthesia Post-op Note  Patient: [name] [name]  Procedure(s) Performed: Procedure(s): ROBOTIC ASSISTED LAPAROSCOPIC NEPHRECTOMY (Right)  Patient Location: PACU  Anesthesia Type:General  Level of Consciousness: awake and alert   Airway and Oxygen Therapy: Patient Spontanous Breathing  Post-op Pain: mild  Post-op Assessment: Post-op Vital signs reviewed, Patient's Cardiovascular Status Stable, Respiratory Function Stable, Patent Airway and No signs of Nausea or vomiting              Post-op Vital Signs: Reviewed and stable  Last Vitals:  Filed Vitals:   01/02/15 1630  BP: 140/72  Pulse: 80  Temp: 36.7 C  Resp: 14    Complications: No apparent anesthesia complications

## 2015-01-02 NOTE — H&P (Signed)
[name] [name] J [name] is an 29 y.o. male.    Chief Complaint: Pre-op Right Robotic Nephrectomy  HPI:   1 - Right Hydronephrotic, Atrophic Kidney with Recurrent Flank Pain - right hydronephrotic kidney with minimal preserved renal parenchyma by ER CT 11/2014 on eval coliky right flank pain. Cr 1.1. Renogram confirms sig compromised right fucntion (36% Rt / 65% Lt) that I feel is likely overestimate of right function due to sig background uptake. Likely 1 artery / 1 vein renovascular anatomy. Left kdiney w/o stones, hydro and appears likely some compensatory hypertrophy.   PMH sig for infant inguinal hernia repair. His PCP is [name] MelnickWilliam Hopper MD. He works with [name].    Today " [name] [name] " is seen to proceed with right robotic nephrectomy for his symptomatic atrophic and hydronephrotic right kidney.   Past Medical History  Diagnosis Date  . Allergy   . Asthma     Childhood asthma only  . Heart murmur   . Pneumonia     hx of in childhood     Past Surgical History  Procedure Laterality Date  . Hernia repair    . Wisdom tooth extraction      4    Family History  Problem Relation Age of Onset  . Hypertension Father   . Cancer Paternal Aunt     pancreatic  . Diabetes Paternal Grandmother   . Heart attack Paternal Grandfather     in 3770s   Social History:  reports that he has been smoking Cigarettes.  He has a 1.5 pack-year smoking history. He has never used smokeless tobacco. He reports that he drinks alcohol. He reports that he does not use illicit drugs.  Allergies:  Allergies  Allergen Reactions  . Ibuprofen     Rash & swelling    No prescriptions prior to admission    No results found for this or any previous visit (from the past 48 hour(s)). No results found.  Review of Systems  Constitutional: Negative.   HENT: Negative.   Eyes: Negative.   Respiratory: Negative.   Cardiovascular: Negative.   Gastrointestinal: Negative.   Genitourinary: Negative.    Musculoskeletal: Negative.   Skin: Negative.   Neurological: Negative.   Endo/Heme/Allergies: Negative.   Psychiatric/Behavioral: Negative.     There were no vitals taken for this visit. Physical Exam  Constitutional: He appears well-developed.  HENT:  Head: Normocephalic.  Eyes: Pupils are equal, round, and reactive to light.  Neck: Normal range of motion.  Cardiovascular: Normal rate.   Respiratory: Effort normal.  GI: Soft.  Genitourinary:  Mild Rt CVAT  Musculoskeletal: Normal range of motion.  Neurological: He is alert.  Skin: Skin is warm.  Psychiatric: He has a normal mood and affect. His behavior is normal. Judgment and thought content normal.     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 His kidney has very little preserved parenchyma and his left kidney appears anatomically and functionally normal. Overall GFR excellent. I do not feel there is any role for any sort of right renal / UPJ reconstruction and agree likely best managed by right simple nephrectomy in elective setting.  We rediscussed the role of nephrectomy with the overall goal of complete surgical excision (negative margins) and better staging / diagnosis and relief of obstructive symptoms. We specifically rediscussed that with removal of the kidney there would be an overall renal function decline with  attendant risks of renal failure and need for dialysis in some cases, and need for kidney-friendly lifestyle post-op with excellent blood pressure and glycemic control. We then rediscussed surgical approaches including robotic and open techniques with robotic associated with a shorter convalescence. I showed the patient on their abdomen the approximately 4-6 incision (trocar) sites as well as presumed extraction sites with robotic approach as well as possible open incision sites. We specifically  readdressed that there may be need to alter operative plans according to intraopertive findings including conversion to open procedure. We discussed specific peri-operative risks including bleeding, infection, deep vein thrombosis, pulmonary embolism, compartment syndrome, nuropathy / neuropraxia, heart attack, stroke, death, as well as long-term risks such as non-cure / need for additional therapy and need for imaging and lab based post-op surveillance protocols if malignancy present. We rediscussed typical hospital course of approximately 2 day hospitalization, need for peri-operative drains / catheters, and typical post-hospital course with return to most non-strenuous activities by 2 weeks and ability to return to most jobs and more strenuous activity such as exercise by 6 weeks  Pt voiced understanding and desire to proceed today as  Planned.   [name] [name] 01/02/2015, 6:14 AM

## 2015-01-02 NOTE — Anesthesia Procedure Notes (Signed)
Procedure Name: Intubation Date/Time: 01/02/2015 12:52 PM Performed by: Epimenio SarinJARVELA, Grete Bosko R Pre-anesthesia Checklist: Patient identified, Emergency Drugs available, Suction available, Patient being monitored and Timeout performed Patient Re-evaluated:Patient Re-evaluated prior to inductionOxygen Delivery Method: Circle system utilized Preoxygenation: Pre-oxygenation with 100% oxygen Intubation Type: IV induction Ventilation: Mask ventilation without difficulty Laryngoscope Size: Mac and 3 Grade View: Grade I Tube type: Oral Tube size: 7.5 mm Number of attempts: 1 Airway Equipment and Method: Stylet Placement Confirmation: ETT inserted through vocal cords under direct vision,  positive ETCO2 and breath sounds checked- equal and bilateral Secured at: 23 cm Tube secured with: Tape Dental Injury: Teeth and Oropharynx as per pre-operative assessment

## 2015-01-02 NOTE — Transfer of Care (Signed)
Immediate Anesthesia Transfer of Care Note  Patient: [name] EngMatthew J Hopson  Procedure(s) Performed: Procedure(s): ROBOTIC ASSISTED LAPAROSCOPIC NEPHRECTOMY (Right)  Patient Location: PACU  Anesthesia Type:General  Level of Consciousness: awake, alert , oriented and patient cooperative  Airway & Oxygen Therapy: Patient Spontanous Breathing and Patient connected to face mask oxygen  Post-op Assessment: Report given to RN and Post -op Vital signs reviewed and stable  Post vital signs: Reviewed and stable  Last Vitals:  Filed Vitals:   01/02/15 1028  BP: 127/82  Pulse: 64  Temp: 36.7 C  Resp: 16    Complications: No apparent anesthesia complications

## 2015-01-03 LAB — BASIC METABOLIC PANEL
Anion gap: 7 (ref 5–15)
BUN: 10 mg/dL (ref 6–20)
CALCIUM: 9.1 mg/dL (ref 8.9–10.3)
CO2: 27 mmol/L (ref 22–32)
Chloride: 105 mmol/L (ref 101–111)
Creatinine, Ser: 1.16 mg/dL (ref 0.61–1.24)
GFR calc Af Amer: >60 mL/min (ref >60)
GLUCOSE: 143 mg/dL — AB (ref 65–99)
POTASSIUM: 4.3 mmol/L (ref 3.5–5.1)
Sodium: 139 mmol/L (ref 135–145)

## 2015-01-03 LAB — HEMOGLOBIN AND HEMATOCRIT, BLOOD
HCT: 38.7 % — ABNORMAL LOW (ref 39.0–52.0)
HEMOGLOBIN: 13.8 g/dL (ref 13.0–17.0)

## 2015-01-03 MED ORDER — ZOLPIDEM TARTRATE 5 MG PO TABS
5.0000 mg | ORAL_TABLET | Freq: Once | ORAL | Status: AC
  Administered 2015-01-03: 5 mg via ORAL
  Filled 2015-01-03: qty 1

## 2015-01-03 NOTE — Op Note (Signed)
NAMNicanor [name]:  [name], [name]               ACCOUNT NO.:  [id number]  MEDICAL RECORD NO.:  [id number]  LOCATION:  1401                         [facility]  PHYSICIAN:  [name] Acheheodore Bharath Bernstein, MD     DATE OF BIRTH:  [birth year]  DATE OF PROCEDURE: 01/02/2015                               OPERATIVE REPORT   DIAGNOSIS:  Atrophic right kidney with current flank pain.  PROCEDURE:  Robotic-assisted laparoscopic right radical nephrectomy.  ESTIMATED BLOOD LOSS:  50 mL.  COMPLICATIONS:  None.  SPECIMENS:  Right kidney and proximal most ureter for permanent pathology.  FINDINGS: 1. Three artery, two vein right renovascular anatomy. 2. Significantly hydronephrotic and atrophic right kidney. 3. Likely evidence of crossing vessel UPJ obstruction as cause of his     renal atrophy.  ASSISTANT: 1. [name] ShanksAmanda Dancey, PA. 2. [name] [name], M.D.  INDICATION:  [name] [name] is a very pleasant, 29 year old gentleman with history of recurrent right flank pain.  He was found on workup of this to have hydronephrotic and very atrophic right kidney.  Options were discussed including observation versus chronic stenting versus reconstruction versus nephrectomy and as the kidney has very little parenchyma and  severe hydronephrosis pt wished to proceed with right nephrectomy analysis assistance.  Informed consent was obtained and placed in medical record.  PROCEDURE IN DETAIL:  The patient being [name] [name], the procedure being right robotic nephrectomy was confirmed.  Procedure was carried out.  Time-out was performed.  Intravenous antibiotics were administered.  General endotracheal anesthesia was introduced.  The patient placed into a right side up full flank position employing 15 degrees of table flexion, superior arm elevator, axillary roll, sequential compression devices, bottom leg bent  leg straight.  Beanbag was deployed. He was further fashioned on the operative table using 3-inch tape over foam padding  across his supra xiphoid chest and his pelvis sterile field was created by first clipper shaving and prepping and draping the patient's entire right flank and hemi-abdomen using chlorhexidine gluconate. Next, a high-flow low pressure pneumoperitoneum was obtained using Veress technique in the right upper quadrant having passed the aspiration and drop test.  Next, an 8 mm robotic camera port was placed in position approximately 4 fingerbreadths superior, lateral to the umbilicus.  Laparoscopic examination of peritoneal cavity revealed no significant adhesions, no visceral injury.  Additional ports were placed as follows, right subcostal 8-mm robotic port, right far lateral 8-mm robotic port, approximately 3 fingerbreadths superior medial to the anterior iliac spine, a right paramedian inferior robotic port approximately 1 handbreadth above the pubic ramus, two system ports in the midline with 12 mm, one two fingerbreadths above the umbilicus and one fingerbreadth below the umbilicus.  Robot was docked and passed to electronic checks.  The liver was quite large not allowing visualization of the relevant peritoneal and retroperitoneal structures as such loose attachments were taken down in the lateral aspect of it as well as medial aspect of the liver and the self retaining laparoscopic grasper was used from a 5 mm subxiphoid port to place by placing liver on  gentle superior traction.  Next, the colon was carefully swept medially allowing aspect visualization of retroperitoneum by incising lateral to  the colon from the area of the cecum to the hepatic flexure and rotating medially.  The duodenum was visualized and was very far medially such that Kocherization was not required. The lower pole of the hydronephrotic kidney was identified, placed on gentle lateral traction.  Dissection proceeded medial to this.  The ureter and gonadal vein were encountered.  The ureter was placed on gentle  lateral traction.  Dissection proceeded within the triangle superiorly towards the  renal hilum.  Renal hilum was somewhat complex and consisted of a lower pole crossing artery vein.  This was felt to likely represent the source of prior UPJ obstruction and renal atrophy.  These were controlled using Hem-o-lok clip on the artery proximally followed by endovascular stapler distally.  There was another small posterior artery at the inferior aspect of the dominant renal vein, was controlled similarly using Hem-o-lok clip proximal vascular load stapler distally. There is another small more superior artery that was a just superior to the superior aspect of the dominant renal vein, these controlled using vascular stapler and the vein was finally controlled using vascular Stapler which resulted in excellent hemostatic control of the hilum. Additional superior medial attachments were taken down using bipolar energy and cautery scissors performing partial adrenal sparing and then the lateral attachments were taken down.  Notably immediately before hilar dissection, the renal pelvis was purposefully incised and drained to allow the hydronephrotic renal pelvis to rotate away from the hilum to allow better visualization of the structure.  Ureter was controlled using Hem-o-lok clip ligation x2. Transection between these completely freed up the right radical nephrectomy specimen ad it was placed in an extra large EndoCatch bag for retrieval. Following this, all sponge and needle counts were correct.  Hemostasis appeared excellent.  Robot was undocked and the specimen retrieved by extending the previous 2 assist port sites connecting them in the midline, removing the nephrectomy specimen was set aside for permanent pathology.  The extraction site was closed at the level of the fascia using figure-of-eight PDS x4 and to the level of Scarpa's using running Vicryl.  All incision sites were infiltrated with  dilute lyophilized Marcaine and closed to level of skin using subcuticular Monocryl followed by Dermabond. Procedure was then terminated.  The patient tolerated the procedure well.  There were no immediate periprocedural complications.  The patient was taken to the postanesthesia care unit in stable condition.          ______________________________ [name] Ache, MD     TM/MEDQ  D:  01/02/2015  T:  01/03/2015  Job:  [id number]

## 2015-01-03 NOTE — Progress Notes (Signed)
Urology Progress Note  1 Day Post-Op   Subjective: AF vss. Pt has walked in hallway. C/o multiple  incisions.     No acute urologic events overnight. Ambulation:   positive Flatus:    positive Bowel movement  negative  Pain: some relief  Objective:  Blood pressure 140/88, pulse 65, temperature 97.5 F (36.4 C), temperature source Oral, resp. rate 18, height 5\' 6"  (1.676 m), weight 68.975 kg (152 lb 1 oz), SpO2 100 %.  Physical Exam:  General:  No acute distress, awake ABD: benign: wounds negative. Genitourinary:  neg Foley: ready for removal    I/O last 3 completed shifts: In: 5147.5 [P.O.:960; I.V.:4187.5] Out: 1000 [Urine:950; Blood:50]  Recent Labs     01/02/15  1555  01/03/15  0550  HGB  13.2  13.8    Recent Labs     01/03/15  0550  NA  139  K  4.3  CL  105  CO2  27  BUN  10  CREATININE  1.16  CALCIUM  9.1  GFRNONAA  >60  GFRAA  >60     No results for input(s): INR, APTT in the last 72 hours.  Invalid input(s): PT   Invalid input(s): ABG  Assessment/Plan:  Pt is to increase activities as tolerated.  D/c foley Encourage for possible d/c tomorrow.

## 2015-01-04 NOTE — Discharge Instructions (Signed)

## 2015-01-04 NOTE — Progress Notes (Signed)
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  Subjective:  AF VSS     No acute urologic events overnight. + flatus. No BM since pre-op prep ( normal qod BM). Ambulation:   positive Flatus:    positive ( small amount) Bowel movement  negative  Pain: some relief  Objective:  Blood pressure 139/93, pulse 68, temperature 98.6 F (37 C), temperature source Oral, resp. rate 18, height 5\' 6"  (1.676 m), weight 68.975 kg (152 lb 1 oz), SpO2 100 %.  Physical Exam:  General:  No acute distress, awake Extremities: extremities normal, atraumatic, no cyanosis or edema Genitourinary:  Abdominal wounds sealed.  Foley: out: voiding.     I/O last 3 completed shifts: In: 4372.5 [P.O.:960; I.V.:3412.5] Out: 5875 [Urine:5875]  Recent Labs     01/02/15  1555  01/03/15  0550  HGB  13.2  13.8    Recent Labs     01/03/15  0550  NA  139  K  4.3  CL  105  CO2  27  BUN  10  CREATININE  1.16  CALCIUM  9.1  GFRNONAA  >60  GFRAA  >60     No results for input(s): INR, APTT in the last 72 hours.  Invalid input(s): PT   Invalid input(s): ABG  Assessment/Plan:  Wound care discussed. Ok to shower.  D/c today. [place] at home.  Pain med per Dr. Berneice HeinrichManny.

## 2015-01-04 NOTE — Progress Notes (Signed)
Patient discharged home, discharge instructions given and explained to patient/father and they verbalized understanding, denies any distress. Accompanied home by family. No wound wound noted, surgical incision clean/dry intact. Transported to the car by staff via wheelchair.

## 2015-01-05 ENCOUNTER — Encounter (HOSPITAL_COMMUNITY): Payer: Self-pay | Admitting: Urology

## 2015-01-08 NOTE — Discharge Summary (Signed)
Physician Discharge Summary  Patient ID: Camelia EngMatthew J Albritton MRN: [id number] DOB/AGE: 10-Apr-1985 29 y.o.  Admit date: 01/02/2015 Discharge date: 01/04/2015  Admission Diagnoses: Right Atrophic Kidney  Discharge Diagnoses:  Active Problems:   Atrophic kidney   Discharged Condition: good  Hospital Course:   1 - Right Atrophic Kidney - underwent right robotic nephrectomy on 01/02/15 the day of admission withtou acute complications. Discharged POD 2. For daily details see progress notes by Dr. Patsi Searsannenbaum.  Consults: None  Significant Diagnostic Studies: labs: per progress notes  Treatments: surgery: right robotic nephrectomy on 01/02/15  Discharge Exam: Blood pressure 139/93, pulse 68, temperature 98.6 F (37 C), temperature source Oral, resp. rate 18, height 5\' 6"  (1.676 m), weight 68.975 kg (152 lb 1 oz), SpO2 100 %.  Please see progress note by Dr. Patsi Searsannenbaum 01/04/15 for discharge day exam.  Disposition: 01-Home or Self Care  Discharge Instructions    Discharge patient    Complete by:  As directed      Discontinue IV    Complete by:  As directed             Medication List    STOP taking these medications        multivitamin with minerals Tabs tablet     tamsulosin 0.4 MG Caps capsule  Commonly known as:  FLOMAX     traMADol 50 MG tablet  Commonly known as:  ULTRAM      TAKE these medications        acetaminophen 500 MG tablet  Commonly known as:  TYLENOL  Take 500-1,000 mg by mouth every 6 (six) hours as needed for moderate pain.     ondansetron 4 MG tablet  Commonly known as:  ZOFRAN  Take 1 tablet (4 mg total) by mouth every 6 (six) hours.     oxyCODONE-acetaminophen 5-325 MG tablet  Commonly known as:  PERCOCET/ROXICET  Take 1-2 tablets by mouth every 6 (six) hours as needed for severe pain.           Follow-up Information    Follow up with Sebastian AcheMANNY, Dariyah Garduno, MD On 01/12/2015.   Specialty:  Urology   Why:  at 8:45 AM for MD visit.    Contact  information:   [address] [place] [address] [phone]       Follow up with Sebastian AcheMANNY, Tatumn Corbridge, MD.   Specialty:  Urology   Why:  per appointment   Contact information:   [address] [place] [address] [phone]       Signed: Sebastian AcheMANNY, Talayia Hjort 01/08/2015, 2:59 PM

## 2017-01-13 IMAGING — NM NM RENAL IMAGING FLOW W/ PHARM
2 series · 12 of 12 positions shown · non-contrast
Comparison: CT 11/05/2014

CLINICAL DATA: RIGHT hydronephrosis.

EXAM:
NUCLEAR MEDICINE RENAL SCAN WITH DIURETIC ADMINISTRATION
TECHNIQUE: Radionuclide angiographic and sequential renal images were obtained
after intravenous injection of radiopharmaceutical. Imaging was
continued during slow intravenous injection of Lasix approximately
15 minutes after the start of the examination.
RADIOPHARMACEUTICALS:  15.5 Jechnetium-WWm MAG3 IV

[Series 1: renal scan · 4.14mm/px · 6 of 40 frames shown (1 of 2)]
[frame 4/40  full-range]
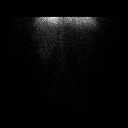
[frame 10/40  full-range]
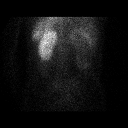
[frame 17/40  full-range]
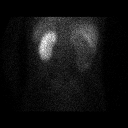
[frame 24/40  full-range]
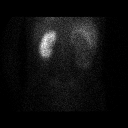
[frame 30/40  full-range]
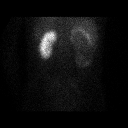
[frame 37/40  full-range]
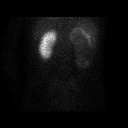

[Series 1: renal scan · 4.14mm/px · 6 of 90 frames shown (2 of 2)]
[frame 8/90]
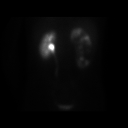
[frame 23/90]
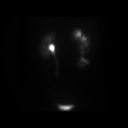
[frame 38/90]
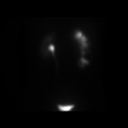
[frame 53/90]
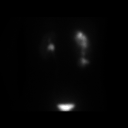
[frame 68/90]
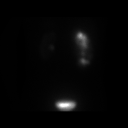
[frame 83/90]
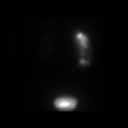

[12 of 12 positions shown; findings below may reference images not displayed]

FINDINGS: Flow:  Delayed arterial flow to RIGHT kidney.

Left renogram: Uniform uptake of counts in the renal cortex. Counts
are promptly excreted into the collecting system and cleared prior
to administration of Lasix. Lasix augment clearance. No postvoid
residual.

Right renogram: There is minimal cortical uptake in the enlarged
RIGHT kidney. There is severe cortical thinning. Minimal excretion
of counts into the collecting system. The RIGHT ureter is not
demonstrated. Moderate postvoid residual within the dilated RIGHT
renal calices.

Differential:

Left kidney = 64 %

Right kidney = 36 %

T1/2 post Lasix :

Left kidney = 6 minutes min

Right kidney = not applicable
IMPRESSION: 1. Severe obstructive hydronephrosis of the RIGHT kidney with
dilatation of the kidney and parenchymal thinning.
2. RIGHT ureter not identified visually.
3. Asymmetric renal differential as above.
4. Normal LEFT kidney.

## 2017-05-22 ENCOUNTER — Other Ambulatory Visit: Payer: Self-pay

## 2017-05-22 ENCOUNTER — Emergency Department (HOSPITAL_BASED_OUTPATIENT_CLINIC_OR_DEPARTMENT_OTHER)
Admission: EM | Admit: 2017-05-22 | Discharge: 2017-05-22 | Disposition: A | Discharge status: Home / Self Care | Payer: BC Managed Care – PPO | Attending: Emergency Medicine | Admitting: Emergency Medicine

## 2017-05-22 ENCOUNTER — Encounter (HOSPITAL_BASED_OUTPATIENT_CLINIC_OR_DEPARTMENT_OTHER): Payer: Self-pay | Admitting: *Deleted

## 2017-05-22 DIAGNOSIS — R69 Illness, unspecified: Secondary | ICD-10-CM

## 2017-05-22 DIAGNOSIS — Z79899 Other long term (current) drug therapy: Secondary | ICD-10-CM | POA: Diagnosis not present

## 2017-05-22 DIAGNOSIS — F17210 Nicotine dependence, cigarettes, uncomplicated: Secondary | ICD-10-CM | POA: Diagnosis not present

## 2017-05-22 DIAGNOSIS — J45909 Unspecified asthma, uncomplicated: Secondary | ICD-10-CM | POA: Insufficient documentation

## 2017-05-22 DIAGNOSIS — R05 Cough: Secondary | ICD-10-CM | POA: Diagnosis present

## 2017-05-22 DIAGNOSIS — J111 Influenza due to unidentified influenza virus with other respiratory manifestations: Secondary | ICD-10-CM | POA: Diagnosis not present

## 2017-05-22 MED ORDER — OSELTAMIVIR PHOSPHATE 75 MG PO CAPS: 75.0000 mg | ORAL_CAPSULE | Freq: Two times a day (BID) | ORAL | 0 refills | Status: DC

## 2017-05-22 MED ORDER — ACETAMINOPHEN 325 MG PO TABS
650.0000 mg | ORAL_TABLET | Freq: Once | ORAL | Status: AC
  Administered 2017-05-22: 650 mg via ORAL
  Filled 2017-05-22: qty 2

## 2017-05-22 NOTE — ED Provider Notes (Signed)
[facility] EMERGENCY DEPARTMENT Provider Note   CSN: [id number] Arrival date & time: 05/22/17  1649     History   Chief Complaint Chief Complaint  Patient presents with  . Cough    HPI [name] [name] is a 32 y.o. male presenting to the ED with acute onset of dry cough, fever, chills, headache since this morning.  Patient states symptoms began all of a sudden.  He states cough is nonproductive.  Has not taken any medications for symptoms, however Tylenol given in triage provided him with some relief.  Denies congestion, sore throat, ear pain, difficulty breathing or swallowing.  Has not had influenza vaccine this season.  States he works at [employer].  The history is provided by the patient.    Past Medical History:  Diagnosis Date  . Allergy   . Asthma    Childhood asthma only  . Heart murmur   . Pneumonia    hx of in childhood     Patient Active Problem List   Diagnosis Date Noted  . 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 ALLERGIC RHINITIS 08/18/2009    Past Surgical History:  Procedure Laterality Date  . HERNIA REPAIR    . 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 WISDOM TOOTH EXTRACTION     4       Home Medications    Prior to Admission medications   Medication Sig Start Date End Date Taking? Authorizing Provider  acetaminophen (TYLENOL) 500 MG tablet Take 500-1,000 mg by mouth every 6 (six) hours as needed for moderate pain.    [provider]  ondansetron (ZOFRAN) 4 MG tablet Take 1 tablet (4 mg total) by mouth every 6 (six) hours. 10/29/14   Patel-Mills, Lorelle FormosaHanna, PA-C  oseltamivir (TAMIFLU) 75 MG capsule Take 1 capsule (75 mg total) by mouth every 12 (twelve) hours. 05/22/17   Robinson, [place] N, PA-C  oxyCODONE-acetaminophen (PERCOCET/ROXICET) 5-325 MG tablet Take 1-2 tablets by mouth  every 6 (six) hours as needed for severe pain. 01/02/15   Harrie Foremanancy, Amanda, PA-C    Family History Family History  Problem Relation Age of Onset  . Hypertension Father   . Cancer Paternal Aunt        pancreatic  . Diabetes Paternal Grandmother   . Heart attack Paternal Grandfather        in 5370s    Social History Social History   Tobacco Use  . Smoking status: Current Every Day Smoker    Packs/day: 0.50    Years: 3.00    Pack years: 1.50    Types: Cigarettes  . Smokeless tobacco: Never Used  Substance Use Topics  . Alcohol use: Yes    Comment: socially  . Drug use: No     Allergies   Ibuprofen   Review of Systems Review of Systems  Constitutional: Positive for chills and fever.  HENT: Negative for congestion, ear pain, sore throat, trouble swallowing and voice change.   Eyes: Negative for photophobia and visual disturbance.  Respiratory: Positive for cough. Negative for shortness of breath.   Gastrointestinal: Negative for abdominal pain.  Musculoskeletal: Positive for myalgias (Generalized).  Neurological: Positive for headaches.     Physical Exam Updated Vital Signs BP 128/71 (BP Location: Right Arm)   Pulse 96   Temp 98.6 F (37 C) (Oral)   Resp 18   Ht 5'  6" (1.676 m)   Wt 70.3 kg (155 lb)   SpO2 100%   BMI 25.02 kg/m   Physical Exam  Constitutional: He appears well-developed and well-nourished. No distress.  Tolerating secretions.  HENT:  Head: Normocephalic and atraumatic.  Right Ear: Tympanic membrane, external ear and ear canal normal.  Left Ear: Tympanic membrane, external ear and ear canal normal.  Mouth/Throat: Uvula is midline and oropharynx is clear and moist. No trismus in the jaw. No uvula swelling.  Eyes: Conjunctivae are normal.  Neck: Normal range of motion. Neck supple.  Cardiovascular: Normal rate, regular rhythm, normal heart sounds and intact distal pulses.  Pulmonary/Chest: Effort normal and breath sounds normal. No respiratory  distress.  Lymphadenopathy:    He has no cervical adenopathy.  Psychiatric: He has a normal mood and affect. His behavior is normal.  Nursing note and vitals reviewed.    ED Treatments / Results  Labs (all labs ordered are listed, but only abnormal results are displayed) Labs Reviewed - No data to display  EKG  EKG Interpretation None       Radiology No results found.  Procedures Procedures (including critical care time)  Medications Ordered in ED Medications  acetaminophen (TYLENOL) tablet 650 mg (650 mg Oral Given 05/22/17 1705)     Initial Impression / Assessment and Plan / ED Course  I have reviewed the triage vital signs and the nursing notes.  Pertinent labs & imaging results that were available during my care of the patient were reviewed by me and considered in my medical decision making (see chart for details).     Patient with symptoms consistent with influenza.  Vitals are stable, low-grade fever on arrival. Fever treated with tylenol with improvement in temp and HR.  No signs of dehydration, tolerating PO's.  Lungs are clear. Due to patient's presentation and physical exam a chest x-ray was not ordered bc likely diagnosis of flu.  Discussed the cost versus benefit of Tamiflu treatment with the patient, pt elects to begin tamilfu. Patient will be discharged with instructions to orally hydrate, rest, and use over-the-counter medications such as anti-inflammatories ibuprofen for muscle aches and Tylenol for fever. Safe for discharge.  Discussed results, findings, treatment and follow up. Patient advised of return precautions. Patient verbalized understanding and agreed with plan.  Final Clinical Impressions(s) / ED Diagnoses   Final diagnoses:  Influenza-like illness    ED Discharge Orders        Ordered    oseltamivir (TAMIFLU) 75 MG capsule  Every 12 hours     05/22/17 2017       Robinson, [place] N, PA-C 05/22/17 2017    Rolan Bucco,  MD 05/22/17 2333

## 2017-05-22 NOTE — ED Triage Notes (Signed)
Cough, chills, body aches since this am.

## 2017-05-22 NOTE — Discharge Instructions (Signed)
Please read instructions below.  You can take tylenol and advil as needed for sore throat or body aches.  Drink plenty of water. Begin taking tamiflu, as prescribed until it is gone. Stay home until your symptoms improve.  Follow up with your primary care provider as needed if symptoms persist. Return to the ER for difficulty swallowing liquids, difficulty breathing, or new or worsening symptoms.

## 2019-04-09 ENCOUNTER — Encounter: Payer: Self-pay | Admitting: Family Medicine

## 2019-04-09 ENCOUNTER — Other Ambulatory Visit: Payer: Self-pay

## 2019-04-09 ENCOUNTER — Ambulatory Visit (INDEPENDENT_AMBULATORY_CARE_PROVIDER_SITE_OTHER): Payer: BC Managed Care – PPO | Admitting: Family Medicine

## 2019-04-09 VITALS — BP 130/86 | HR 82 | Temp 97.9°F | Ht 66.0 in | Wt 225.4 lb

## 2019-04-09 DIAGNOSIS — K5900 Constipation, unspecified: Secondary | ICD-10-CM

## 2019-04-09 DIAGNOSIS — K219 Gastro-esophageal reflux disease without esophagitis: Secondary | ICD-10-CM | POA: Insufficient documentation

## 2019-04-09 DIAGNOSIS — Z905 Acquired absence of kidney: Secondary | ICD-10-CM

## 2019-04-09 DIAGNOSIS — Z87891 Personal history of nicotine dependence: Secondary | ICD-10-CM

## 2019-04-09 MED ORDER — PANTOPRAZOLE SODIUM 40 MG PO TBEC: 40.0000 mg | DELAYED_RELEASE_TABLET | Freq: Every day | ORAL | 1 refills | Status: DC

## 2019-04-09 NOTE — Assessment & Plan Note (Signed)
Encouraged abstinence.  

## 2019-04-09 NOTE — Assessment & Plan Note (Signed)
Continue OTC miralax as needed.

## 2019-04-09 NOTE — Patient Instructions (Signed)
It was very nice to see you today!  Please use the Protonix daily for 4 to 6 weeks.  Let me know if your symptoms do not improve.  Please keep an eye on your blood pressure at home and let me know if persistently 140/90 or higher.  Please come back in a few months for your physical blood work, or sooner if needed.  Take care, Dr Jimmey Ralph  Please try these tips to maintain a healthy lifestyle:   Eat at least 3 REAL meals and 1-2 snacks per day.  Aim for no more than 5 hours between eating.  If you eat breakfast, please do so within one hour of getting up.    Each meal should contain half fruits/vegetables, one quarter protein, and one quarter carbs (no bigger than a computer mouse)   Cut down on sweet beverages. This includes juice, soda, and sweet tea.     Drink at least 1 glass of water with each meal and aim for at least 8 glasses per day   Exercise at least 150 minutes every week.

## 2019-04-09 NOTE — Assessment & Plan Note (Signed)
No red flags. Start Protonix 40 mg daily for 4 to 6 weeks.  Discussed lifestyle modifications including dietary changes and weight loss.  If continues to have issues off PPI would consider checking H. Pylori antibody.

## 2019-04-09 NOTE — Progress Notes (Signed)
   [name] [name] is a 34 y.o. male who presents today for an office visit.  Assessment/Plan:  Chronic Problems Addressed Today: Gastroesophageal reflux disease No red flags. Start Protonix 40 mg daily for 4 to 6 weeks.  Discussed lifestyle modifications including dietary changes and weight loss.  If continues to have issues off PPI would consider checking H. Pylori antibody.   Former smoker Encouraged abstinence.   Constipation Continue OTC miralax as needed.   Patient will follow up in a few months for CPE.     Subjective:  HPI:  Started a month or two ago. Started having reflux and abdominal pain. Has had some regurgitation of stomach acid with belching.  No dysphagia. Tried taking prilosec which has helped. No early satiety. No nausea or vomiting. No diarrhea. Has chronic constipation which is at its baseline. No change with food. Coffee makes it worse.        Objective:  Physical Exam: BP 130/86   Pulse 82   Temp 97.9 F (36.6 C)   Ht 5\' 6"  (1.676 m)   Wt 225 lb 6.1 oz (102.2 kg)   SpO2 99%   BMI 36.38 kg/m   Gen: No acute distress, resting comfortably CV: Regular rate and rhythm with no murmurs appreciated Pulm: Normal work of breathing, clear to auscultation bilaterally with no crackles, wheezes, or rhonchi GI: Bowel sounds present.  Soft, nontender, nondistended.  Murphy sign negative. Neuro: Grossly normal, moves all extremities Psych: Normal affect and thought content      [name] M. , MD 04/09/2019 8:49 AM

## 2019-06-03 ENCOUNTER — Encounter: Payer: BC Managed Care – PPO | Admitting: Family Medicine

## 2019-06-21 ENCOUNTER — Ambulatory Visit: Payer: BC Managed Care – PPO | Attending: Internal Medicine

## 2019-06-21 DIAGNOSIS — Z23 Encounter for immunization: Secondary | ICD-10-CM

## 2019-06-21 NOTE — Progress Notes (Signed)
   Covid-19 Vaccination Clinic  Name:  JOAOVICTOR KRONE    MRN: [id number] DOB: Mar 16, 1985  06/21/2019  Mr. Bartel was observed post Covid-19 immunization for 15 minutes without incident. He was provided with Vaccine Information Sheet and instruction to access the V-Safe system.   Mr. Corkins was instructed to call 911 with any severe reactions post vaccine: Marland Kitchen Difficulty breathing  . Swelling of face and throat  . A fast heartbeat  . A bad rash all over body  . Dizziness and weakness   Immunizations Administered    Name Date Dose VIS Date Route   Pfizer COVID-19 Vaccine 06/21/2019 11:23 AM 0.3 mL 02/22/2019 Intramuscular   Manufacturer: [employer], [employer]   Lot: [id number]   NDC: [address]-8

## 2019-06-26 ENCOUNTER — Encounter: Payer: BC Managed Care – PPO | Admitting: Family Medicine

## 2019-07-15 ENCOUNTER — Ambulatory Visit: Payer: BC Managed Care – PPO | Attending: Internal Medicine

## 2019-07-15 DIAGNOSIS — Z23 Encounter for immunization: Secondary | ICD-10-CM

## 2019-07-15 NOTE — Progress Notes (Signed)
   Covid-19 Vaccination Clinic  Name:  [name] [name]    MRN: [id number] DOB: [birth year]  07/15/2019  [name] was observed post Covid-19 immunization for 15 minutes without incident. He was provided with Vaccine Information Sheet and instruction to access the V-Safe system.   [name] was instructed to call 911 with any severe reactions post vaccine: Marland Kitchen Difficulty breathing  . Swelling of face and throat  . A fast heartbeat  . A bad rash all over body  . Dizziness and weakness   Immunizations Administered    Name Date Dose VIS Date Route   Pfizer COVID-19 Vaccine 07/15/2019 10:46 AM 0.3 mL 05/08/2018 Intramuscular   Manufacturer: [employer], [employer]   Lot: [id number]   NDC: [address]-3

## 2019-10-04 ENCOUNTER — Other Ambulatory Visit: Payer: Self-pay | Admitting: Family Medicine

## 2020-01-10 ENCOUNTER — Other Ambulatory Visit: Payer: Self-pay

## 2020-01-10 ENCOUNTER — Ambulatory Visit: Payer: BC Managed Care – PPO | Admitting: Family Medicine

## 2020-01-10 ENCOUNTER — Encounter: Payer: Self-pay | Admitting: Family Medicine

## 2020-01-10 ENCOUNTER — Other Ambulatory Visit: Payer: Self-pay | Admitting: *Deleted

## 2020-01-10 VITALS — BP 124/84 | HR 96 | Temp 98.2°F | Ht 66.0 in | Wt 250.0 lb

## 2020-01-10 DIAGNOSIS — Z23 Encounter for immunization: Secondary | ICD-10-CM

## 2020-01-10 DIAGNOSIS — R109 Unspecified abdominal pain: Secondary | ICD-10-CM

## 2020-01-10 DIAGNOSIS — K5900 Constipation, unspecified: Secondary | ICD-10-CM

## 2020-01-10 DIAGNOSIS — K219 Gastro-esophageal reflux disease without esophagitis: Secondary | ICD-10-CM

## 2020-01-10 NOTE — Patient Instructions (Signed)
It was very nice to see you today!  We will check blood work today.  You may be constipated.  Please take enough MiraLAX to have 1 good bowel for the next few days.  We will call you next week with the results.  We may need to get an x-ray of your abdomen depending on results and progression of your symptoms.  Take care, Dr Jimmey Ralph  Please try these tips to maintain a healthy lifestyle:   Eat at least 3 REAL meals and 1-2 snacks per day.  Aim for no more than 5 hours between eating.  If you eat breakfast, please do so within one hour of getting up.    Each meal should contain half fruits/vegetables, one quarter protein, and one quarter carbs (no bigger than a computer mouse)   Cut down on sweet beverages. This includes juice, soda, and sweet tea.     Drink at least 1 glass of water with each meal and aim for at least 8 glasses per day   Exercise at least 150 minutes every week.

## 2020-01-10 NOTE — Progress Notes (Signed)
   [name] [name] is a 34 y.o. male who presents today for an office visit.  Assessment/Plan:  New/Acute Problems: Abdominal Pain No red flags.  Benign abdominal exam.  Will check CMET, CBC, and TSH.  Will likely need KUB to evaluate stool burden depending on results.  Recommend he start MiraLAX to have a least 1 good soft bowel movement for the next few days to see if this helps with symptoms.  Chronic Problems Addressed Today: Constipation Could be contributing to his abdominal pain.  Recommended restarting MiraLAX at 1 follow-up abdomen daily.  Gastroesophageal reflux disease Continue Protonix 40 mg daily. Symptoms have slightly worsened but manageable.   Flu vaccine given today.     Subjective:  HPI:   Symptoms started about 10 days ago. Has right upper quadrant abdominal pain.  Symptoms have been about the same. Nothing better or worse. No difference with eating. No nausea or vomiting. Had some diarrhea yesterday. Still having some reflux but is taking protonix.  Has a history of constipation.  Does not have regular bowel movements.  Is having more burping as well.       Objective:  Physical Exam: BP 124/84   Pulse 96   Temp 98.2 F (36.8 C) (Temporal)   Ht 5\' 6"  (1.676 m)   Wt 250 lb (113.4 kg)   SpO2 97%   BMI 40.35 kg/m   Wt Readings from Last 3 Encounters:  01/10/20 250 lb (113.4 kg)  04/09/19 225 lb 6.1 oz (102.2 kg)  05/22/17 155 lb (70.3 kg)  Gen: No acute distress, resting comfortably CV: Regular rate and rhythm with no murmurs appreciated Pulm: Normal work of breathing, clear to auscultation bilaterally with no crackles, wheezes, or rhonchi GI: Bowel sounds present.  Soft, nontender, nondistended.  Murphy sign negative. Neuro: Grossly normal, moves all extremities Psych: Normal affect and thought content      Dashiell Franchino M. 07/22/17, MD 01/10/2020 1:59 PM

## 2020-01-10 NOTE — Assessment & Plan Note (Signed)
Could be contributing to his abdominal pain.  Recommended restarting MiraLAX at 1 follow-up abdomen daily.

## 2020-01-10 NOTE — Assessment & Plan Note (Signed)
Continue Protonix 40 mg daily. Symptoms have slightly worsened but manageable.

## 2020-01-11 LAB — COMPREHENSIVE METABOLIC PANEL
AG Ratio: 1.8 (calc) (ref 1.0–2.5)
ALT: 26 U/L (ref 9–46)
AST: 21 U/L (ref 10–40)
Albumin: 4.9 g/dL (ref 3.6–5.1)
Alkaline phosphatase (APISO): 86 U/L (ref 36–130)
BUN: 14 mg/dL (ref 7–25)
CO2: 24 mmol/L (ref 20–32)
Calcium: 10.2 mg/dL (ref 8.6–10.3)
Chloride: 102 mmol/L (ref 98–110)
Creat: 1.33 mg/dL (ref 0.60–1.35)
Globulin: 2.7 g/dL (calc) (ref 1.9–3.7)
Glucose, Bld: 89 mg/dL (ref 65–99)
Potassium: 5 mmol/L (ref 3.5–5.3)
Sodium: 139 mmol/L (ref 135–146)
Total Bilirubin: 0.5 mg/dL (ref 0.2–1.2)
Total Protein: 7.6 g/dL (ref 6.1–8.1)

## 2020-01-11 LAB — CBC
HCT: 41.9 % (ref 38.5–50.0)
Hemoglobin: 15.1 g/dL (ref 13.2–17.1)
MCH: 34.1 pg — ABNORMAL HIGH (ref 27.0–33.0)
MCHC: 36 g/dL (ref 32.0–36.0)
MCV: 94.6 fL (ref 80.0–100.0)
MPV: 10.6 fL (ref 7.5–12.5)
Platelets: 248 10*3/uL (ref 140–400)
RBC: 4.43 10*6/uL (ref 4.20–5.80)
RDW: 11.8 % (ref 11.0–15.0)
WBC: 7.4 10*3/uL (ref 3.8–10.8)

## 2020-01-11 LAB — TSH: TSH: 2.59 mIU/L (ref 0.40–4.50)

## 2020-01-13 NOTE — Progress Notes (Signed)
Please inform patient of the following:  Labs all NORMAL. No explanation for abdominal pain on his labs. Would like for him to let us know if symptoms are not improving with miralax and we can check an xray.  Katina Degree. Jimmey Ralph, MD 01/13/2020 10:49 AM

## 2020-04-03 ENCOUNTER — Other Ambulatory Visit: Payer: Self-pay | Admitting: Family Medicine

## 2020-04-13 ENCOUNTER — Ambulatory Visit (INDEPENDENT_AMBULATORY_CARE_PROVIDER_SITE_OTHER): Payer: BC Managed Care – PPO | Admitting: Family Medicine

## 2020-04-13 ENCOUNTER — Other Ambulatory Visit: Payer: Self-pay

## 2020-04-13 ENCOUNTER — Encounter: Payer: Self-pay | Admitting: Family Medicine

## 2020-04-13 VITALS — BP 125/85 | HR 95 | Temp 97.6°F | Ht 66.0 in | Wt 252.0 lb

## 2020-04-13 DIAGNOSIS — K219 Gastro-esophageal reflux disease without esophagitis: Secondary | ICD-10-CM

## 2020-04-13 NOTE — Patient Instructions (Signed)
It was very nice to see you today!  I am concerned that she may have a stomach ulcer.  Please increase your Protonix to twice daily.  I will place referral to GI specialist.  You will probably need endoscopy.  Please let us know if you have not heard from them by next week.  Take care, Dr Jimmey Ralph  Please try these tips to maintain a healthy lifestyle:   Eat at least 3 REAL meals and 1-2 snacks per day.  Aim for no more than 5 hours between eating.  If you eat breakfast, please do so within one hour of getting up.    Each meal should contain half fruits/vegetables, one quarter protein, and one quarter carbs (no bigger than a computer mouse)   Cut down on sweet beverages. This includes juice, soda, and sweet tea.     Drink at least 1 glass of water with each meal and aim for at least 8 glasses per day   Exercise at least 150 minutes every week.

## 2020-04-13 NOTE — Assessment & Plan Note (Signed)
Symptoms have continued to worsen.  Concern for possible peptic ulcer given his association with food.  Given his lack of improvement on PPI will place referral to GI.  May need endoscopic evaluation.  He will increase Protonix 40 mg twice daily until seen by GI.

## 2020-04-13 NOTE — Progress Notes (Signed)
   [name] [name] is a 35 y.o. male who presents today for an office visit.  Assessment/Plan:  Chronic Problems Addressed Today: Gastroesophageal reflux disease Symptoms have continued to worsen.  Concern for possible peptic ulcer given his association with food.  Given his lack of improvement on PPI will place referral to GI.  May need endoscopic evaluation.  He will increase Protonix 40 mg twice daily until seen by GI.    Subjective:  HPI:  Patient here for follow-up.  Still having issues with reflux, bloating, and gas buildup.  He is on Protonix 40 mg daily.  He thinks it has helped some with his acid reflux symptoms though still has issues with bloating and gas buildup.  No early satiety.  Some constipation.  No diarrhea.  No melena or hematochezia.  Sometimes has nausea.  Sometimes has regurgitation.       Objective:  Physical Exam: BP 125/85   Pulse 95   Temp 97.6 F (36.4 C) (Temporal)   Ht 5\' 6"  (1.676 m)   Wt 252 lb (114.3 kg)   SpO2 98%   BMI 40.67 kg/m   Wt Readings from Last 3 Encounters:  04/13/20 252 lb (114.3 kg)  01/10/20 250 lb (113.4 kg)  04/09/19 225 lb 6.1 oz (102.2 kg)  Gen: No acute distress, resting comfortably CV: Regular rate and rhythm with no murmurs appreciated Pulm: Normal work of breathing, clear to auscultation bilaterally with no crackles, wheezes, or rhonchi Neuro: Grossly normal, moves all extremities Psych: Normal affect and thought content      Caleb M. 04/11/19, MD 04/13/2020 10:16 AM

## 2020-05-28 ENCOUNTER — Encounter: Payer: Self-pay | Admitting: Gastroenterology

## 2020-05-28 ENCOUNTER — Ambulatory Visit (INDEPENDENT_AMBULATORY_CARE_PROVIDER_SITE_OTHER): Payer: Self-pay | Admitting: Gastroenterology

## 2020-05-28 ENCOUNTER — Other Ambulatory Visit: Payer: Self-pay

## 2020-05-28 VITALS — BP 130/80 | HR 85 | Ht 66.0 in | Wt 256.0 lb

## 2020-05-28 DIAGNOSIS — K219 Gastro-esophageal reflux disease without esophagitis: Secondary | ICD-10-CM

## 2020-05-28 DIAGNOSIS — Z79899 Other long term (current) drug therapy: Secondary | ICD-10-CM

## 2020-05-28 DIAGNOSIS — K5909 Other constipation: Secondary | ICD-10-CM

## 2020-05-28 NOTE — Addendum Note (Signed)
Addended by: Benancio Deeds on: 05/28/2020 12:46 PM   Modules accepted: Level of Service

## 2020-05-28 NOTE — Patient Instructions (Addendum)
If you are age 34 or older, your body mass index should be between 23-30. Your Body mass index is 41.32 kg/m. If this is out of the aforementioned range listed, please consider follow up with your Primary Care Provider.  If you are age 57 or younger, your body mass index should be between 19-25. Your Body mass index is 41.32 kg/m. If this is out of the aformentioned range listed, please consider follow up with your Primary Care Provider.   Continue Protonix.  Follow up as needed.   Thank you for entrusting me with your care and for choosing [facility], Dr. Ileene Patrick

## 2020-05-28 NOTE — Progress Notes (Signed)
HPI :  35 year old male with a history of GERD, BMI greater than 40, called asthma, referred by Jacquiline Doe, MD for GERD.  Patient states he is developed reflux symptoms since the fall 2020.  Main symptoms are pyrosis, can also have regurgitation of food as well as pressure in his chest.  He was started on Protonix 40 mg once daily at that time.  This did provide some relief however he continued to have breakthrough symptoms fairly frequently.  This past January 2022 he had his dose of Protonix increased to 40 mg twice daily.  This is worked really well for him.  He denies any breakthrough symptoms on this regimen and is functioning much better.  He denies any dysphagia.  No nausea or vomiting.  No abdominal pains.  He is doing much better in this regard.  He has never had a prior upper endoscopy.  He does occasionally have some nocturnal symptoms.  He does endorse chronic constipation, he will have 1 bowel movement every few days.  He uses MiraLAX as needed which works well when he takes it but does not use it routinely.  No blood in his stools.  He states these symptoms are longstanding.  He denies any family history of colon cancer, no family history of esophageal cancer or gastric cancer.  His aunt had pancreatic cancer and he asked about screening for that.  No other family members who have had pancreatic cancer.  He had a remote history of tobacco use for a few years and then quit.  He states he gained about 80 pounds since 2020, he thinks that correlates with worsening of his reflux symptoms.  He previously worked night shift and then about 2 years ago switched to more daytime work.  He currently works at a [employer].   Past Medical History:  Diagnosis Date  . Allergy   . Asthma    Childhood asthma only  . GERD (gastroesophageal reflux disease)   . Heart murmur   . Pneumonia    hx of in childhood      Past Surgical History:  Procedure Laterality Date  . HERNIA REPAIR     as a  child, unsure of location  . ROBOT ASSISTED LAPAROSCOPIC NEPHRECTOMY Right 01/02/2015   Procedure: ROBOTIC ASSISTED LAPAROSCOPIC NEPHRECTOMY;  Surgeon: Sebastian Ache, MD;  Location: [facility];  Service: Urology;  Laterality: Right;  . WISDOM TOOTH EXTRACTION     4   Family History  Problem Relation Age of Onset  . Hypertension Father   . Cancer Paternal Aunt        pancreatic  . Diabetes Paternal Grandmother   . Heart attack Paternal Grandfather        in 51s  . Hypertension Mother    Social History   Tobacco Use  . Smoking status: Former Smoker    Packs/day: 0.50    Years: 3.00    Pack years: 1.50    Types: Cigarettes    Quit date: 10/24/2018    Years since quitting: 1.5  . Smokeless tobacco: Never Used  Vaping Use  . Vaping Use: Former  . Devices: only did it one to two months  Substance Use Topics  . Alcohol use: Yes    Comment: socially  . Drug use: No   Current Outpatient Medications  Medication Sig Dispense Refill  . pantoprazole (PROTONIX) 40 MG tablet Take 40 mg by mouth 2 (two) times daily before a meal.     No current  facility-administered medications for this visit.   Allergies  Allergen Reactions  . Ibuprofen     Rash & swelling     Review of Systems: All systems reviewed and negative except where noted in HPI.   Lab Results  Component Value Date   WBC 7.4 01/10/2020   HGB 15.1 01/10/2020   HCT 41.9 01/10/2020   MCV 94.6 01/10/2020   PLT 248 01/10/2020    Lab Results  Component Value Date   CREATININE 1.33 01/10/2020   BUN 14 01/10/2020   NA 139 01/10/2020   K 5.0 01/10/2020   CL 102 01/10/2020   CO2 24 01/10/2020    Lab Results  Component Value Date   ALT 26 01/10/2020   AST 21 01/10/2020   ALKPHOS 63 11/05/2014   BILITOT 0.5 01/10/2020     Physical Exam: BP 130/80   Pulse 85   Ht 5\' 6"  (1.676 m)   Wt 256 lb (116.1 kg)   BMI 41.32 kg/m  Constitutional: Pleasant,well-developed, male in no acute distress. HEENT:  Normocephalic and atraumatic. Conjunctivae are normal. No scleral icterus. Neck supple.  Cardiovascular: Normal rate, regular rhythm.  Pulmonary/chest: Effort normal and breath sounds normal.  Abdominal: Soft, nondistended, protuberant, nontender.  There are no masses palpable.  Extremities: no edema Lymphadenopathy: No cervical adenopathy noted. Neurological: Alert and oriented to person place and time. Skin: Skin is warm and dry. No rashes noted. Psychiatric: Normal mood and affect. Behavior is normal.   ASSESSMENT AND PLAN: 35 year old male here for new patient consultation regarding the following:  GERD Morbid obesity - BMI greater than 40 Long term use of PPI Chronic constipation  As above, patient has developed worsening reflux symptoms over the past 2 years.  Time course appears to correlate well with significant weight gain over this time.  We discussed that weight gain is the likely cause of his worsening reflux symptoms.  We discussed that weight loss long-term would likely help reduce his reflux symptoms and need for medication, it would also benefit his long-term health in other areas.  I offered him a referral to a weight loss clinic to help with this although he is not interested at this time with the assistance, he thinks he can do it on his own.  Otherwise we discussed reflux in general, we discussed treatment options.  He is on high-dose Protonix at this time with control of symptoms.  I discussed long-term risks of chronic PPI use with him, long-term want to use the lowest dose needed to control symptoms.  Alternatively if he did not want to take medications to treat this, there are also endoscopic and surgical options although he is not a candidate for those given his current BMI.  I do not feel strongly that he warrants an EGD right now given that his symptoms are well controlled and symptoms only for the past 2 years.  He will work on weight loss and see if this will help.  I  recommended avoiding eating within 3 hours of bedtime and sleeping with the head of the bed elevated if he is having nocturnal symptoms.  He will continue Protonix, he can try dose reduction to 40 mg once daily over time and titrate up or down as tolerated.  For his constipation would recommend use of MiraLAX daily rather than as needed to provide regular bowel habits.  He has no alarm symptoms.  We otherwise discussed his family history of pancreatic cancer, only one second-degree relative with this, he  does not meet criteria for pancreatic cancer screening at this time but will be mindful of symptoms and contact me if he has any questions about this moving forward.  He can follow-up with me in 1 year for reassessment, call sooner if any issues or questions.  Plan: - weight loss recommended as above - avoid eating within 3 hours of bedtime, sleep with head of bed elevated - discussed long-term risks of chronic PPI use - continue Protonix, would use lowest dose needed to control symptoms, he will try titrating down as tolerated over time - MiraLAX once daily, titrate up or down as needed - follow-up in 1 year or sooner with any issues or questions.  Ileene Patrick, MD [facility]  CC: Ardith Dark, MD

## 2021-12-06 ENCOUNTER — Encounter: Payer: Self-pay | Admitting: *Deleted

## 2022-02-24 ENCOUNTER — Encounter: Payer: Self-pay | Admitting: *Deleted
